# Patient Record
Sex: Female | Born: 1965
Health system: Southern US, Community
[De-identification: ages and names within clinical notes are randomized; demographics above are authoritative.]

## PROBLEM LIST (undated history)

## (undated) DIAGNOSIS — Z5189 Encounter for other specified aftercare: Secondary | ICD-10-CM

## (undated) DIAGNOSIS — Z86010 Personal history of colonic polyps: Secondary | ICD-10-CM

## (undated) DIAGNOSIS — K219 Gastro-esophageal reflux disease without esophagitis: Secondary | ICD-10-CM

## (undated) DIAGNOSIS — R768 Other specified abnormal immunological findings in serum: Secondary | ICD-10-CM

## (undated) HISTORY — PX: OVARIAN CYST SURGERY: SHX726

## (undated) HISTORY — PX: OTHER SURGICAL HISTORY: SHX169

## (undated) HISTORY — DX: Personal history of colonic polyps: Z86.010

## (undated) HISTORY — DX: Encounter for other specified aftercare: Z51.89

## (undated) HISTORY — PX: UPPER GASTROINTESTINAL ENDOSCOPY: SHX188

## (undated) HISTORY — DX: Other specified abnormal immunological findings in serum: R76.8

## (undated) HISTORY — DX: Gastro-esophageal reflux disease without esophagitis: K21.9

---

## 1993-11-08 DIAGNOSIS — Z5189 Encounter for other specified aftercare: Secondary | ICD-10-CM

## 1993-11-08 HISTORY — DX: Encounter for other specified aftercare: Z51.89

## 2007-11-09 DIAGNOSIS — R768 Other specified abnormal immunological findings in serum: Secondary | ICD-10-CM

## 2007-11-09 HISTORY — PX: COLONOSCOPY: SHX174

## 2007-11-09 HISTORY — PX: ESOPHAGOGASTRODUODENOSCOPY: SHX1529

## 2007-11-09 HISTORY — DX: Other specified abnormal immunological findings in serum: R76.8

## 2008-12-23 ENCOUNTER — Ambulatory Visit: Payer: Self-pay | Admitting: Gynecology

## 2009-02-24 ENCOUNTER — Ambulatory Visit: Payer: Self-pay | Admitting: Gynecology

## 2012-12-02 ENCOUNTER — Ambulatory Visit (INDEPENDENT_AMBULATORY_CARE_PROVIDER_SITE_OTHER): Payer: BC Managed Care – PPO | Admitting: Family Medicine

## 2012-12-02 VITALS — BP 140/84 | HR 88 | Temp 98.4°F | Resp 18 | Ht 60.75 in | Wt 130.0 lb

## 2012-12-02 DIAGNOSIS — R1011 Right upper quadrant pain: Secondary | ICD-10-CM

## 2012-12-02 DIAGNOSIS — R1012 Left upper quadrant pain: Secondary | ICD-10-CM

## 2012-12-02 DIAGNOSIS — R195 Other fecal abnormalities: Secondary | ICD-10-CM

## 2012-12-02 DIAGNOSIS — R319 Hematuria, unspecified: Secondary | ICD-10-CM

## 2012-12-02 DIAGNOSIS — R1013 Epigastric pain: Secondary | ICD-10-CM

## 2012-12-02 DIAGNOSIS — K921 Melena: Secondary | ICD-10-CM

## 2012-12-02 LAB — POCT CBC
HCT, POC: 42.1 % (ref 37.7–47.9)
Hemoglobin: 13.2 g/dL (ref 12.2–16.2)
Lymph, poc: 2 (ref 0.6–3.4)
MCH, POC: 26.7 pg — AB (ref 27–31.2)
MCHC: 31.4 g/dL — AB (ref 31.8–35.4)
MPV: 11 fL (ref 0–99.8)
POC LYMPH PERCENT: 35.7 %L (ref 10–50)
POC MID %: 5.4 %M (ref 0–12)
RDW, POC: 14.5 %
WBC: 5.6 10*3/uL (ref 4.6–10.2)

## 2012-12-02 LAB — POCT UA - MICROSCOPIC ONLY: Crystals, Ur, HPF, POC: NEGATIVE

## 2012-12-02 LAB — POCT URINALYSIS DIPSTICK
Bilirubin, UA: NEGATIVE
Glucose, UA: NEGATIVE
Spec Grav, UA: 1.02

## 2012-12-02 NOTE — Progress Notes (Signed)
Subjective:    Patient ID: Courtney Figueroa, female    DOB: 1966-09-29, 47 y.o.   MRN: [id number]  HPI Courtney Figueroa is a 47 y.o. female Language barrier - here with dtr - translating:  2 weeks ago - upset stomach - felt "upset".  Took dayquil for a possible cold.  More soreness after dayquil.  More stomach soreness past week..  No fever, no n/v, no diarrhea, no blood in stool.  Burning sensation in upper stomach.  More sore today after food. Taking prilosec - twice per day for past week.  Unknown if improving or not. Dark stool - 6 days ago and this am. Sometimes black stool and then normal stool.  Took pepto bismol  Day prior to dark stool and throughout this week. No dysuria or new urinary sx's.     Review of Systems  Constitutional: Negative for fever and chills.  Gastrointestinal: Positive for abdominal pain. Negative for nausea, vomiting and constipation.       Dark stool as in hpi.   Genitourinary: Negative for dysuria, urgency, frequency and difficulty urinating.  Neurological: Negative for dizziness and syncope.       Objective:   Physical Exam  Vitals reviewed. Constitutional: She is oriented to person, place, and time. She appears well-developed and well-nourished. No distress.  Pulmonary/Chest: Effort normal.  Abdominal: Soft. Bowel sounds are normal. She exhibits no distension. There is no hepatosplenomegaly. There is no tenderness. There is no CVA tenderness, no tenderness at McBurney's point and negative Murphy's sign. No hernia.  Neurological: She is alert and oriented to person, place, and time.  Skin: Skin is warm and dry. No rash noted. She is not diaphoretic.  Psychiatric: She has a normal mood and affect. Her behavior is normal.   Results for orders placed in visit on 12/02/12  IFOBT (OCCULT BLOOD)      Component Value Range   IFOBT Negative    POCT CBC      Component Value Range   WBC 5.6  4.6 - 10.2 K/uL   Lymph, poc 2.0  0.6 - 3.4   POC LYMPH PERCENT 35.7  10 - 50 %L     MID (cbc) 0.3  0 - 0.9   POC MID % 5.4  0 - 12 %M   POC Granulocyte 3.3  2 - 6.9   Granulocyte percent 58.9  37 - 80 %G   RBC 4.94  4.04 - 5.48 M/uL   Hemoglobin 13.2  12.2 - 16.2 g/dL   HCT, POC 40.9  81.1 - 47.9 %   MCV 85.3  80 - 97 fL   MCH, POC 26.7 (*) 27 - 31.2 pg   MCHC 31.4 (*) 31.8 - 35.4 g/dL   RDW, POC 91.4     Platelet Count, POC 285  142 - 424 K/uL   MPV 11.0  0 - 99.8 fL  POCT URINALYSIS DIPSTICK      Component Value Range   Color, UA yellow     Clarity, UA clear     Glucose, UA neg     Bilirubin, UA neg     Ketones, UA 15     Spec Grav, UA 1.020     Blood, UA small     pH, UA 7.0     Protein, UA neg     Urobilinogen, UA 0.2     Nitrite, UA neg     Leukocytes, UA Negative    POCT UA - MICROSCOPIC ONLY  Component Value Range   WBC, Ur, HPF, POC 0-1     RBC, urine, microscopic 2-5     Bacteria, U Microscopic trace     Mucus, UA neg     Epithelial cells, urine per micros 0-1     Crystals, Ur, HPF, POC neg     Casts, Ur, LPF, POC neg     Yeast, UA neg          Assessment & Plan:  Courtney Figueroa is a 47 y.o. female  1. Epigastric pain  IFOBT POC (occult bld, rslt in office), H. pylori antibody, IgG, Comprehensive metabolic panel, POCT CBC, Lipase, POCT urinalysis dipstick, POCT UA - Microscopic Only  2. LUQ abdominal pain  IFOBT POC (occult bld, rslt in office), H. pylori antibody, IgG, Comprehensive metabolic panel, POCT CBC, Lipase, POCT urinalysis dipstick, POCT UA - Microscopic Only  3. RUQ abdominal pain  IFOBT POC (occult bld, rslt in office), H. pylori antibody, IgG, Comprehensive metabolic panel, POCT CBC, Lipase, POCT urinalysis dipstick, POCT UA - Microscopic Only  4. Black stool  IFOBT POC (occult bld, rslt in office), H. pylori antibody, IgG, Comprehensive metabolic panel, POCT CBC, Lipase  5. Hematuria  Urine culture   Epigastric>LUQ/RUQ pain.  Symptomatology likely gastritis/GERD,  Dark stools likely d/t bismuth in Pepto use prior. Small  hematuria without urinary sx's - will check urine cx, cont PPI BID, labs above including h pylori, lipase, CMP.  Recheck in few days - if still with pain - consider recheck u/a vs CT to rule out nephrolith as cause. rtc precautions given.  Translator present - understanding expressed.   Patient Instructions  Drink plenty of fluids, continue omeprazole twice per day.  Avoid fried and fatty foods, and minimize caffeine use. Your should receive a call or letter about your lab results within the next week to 10 days.   Recheck in 3 days - 12/06/11 after 2pm.  Return to the clinic or go to the nearest emergency room if any of your symptoms worsen or new symptoms occur.

## 2012-12-02 NOTE — Patient Instructions (Addendum)
Drink plenty of fluids, continue omeprazole twice per day.  Avoid fried and fatty foods, and minimize caffeine use. Your should receive a call or letter about your lab results within the next week to 10 days.   Recheck in 3 days - 12/06/11 after 2pm.  Return to the clinic or go to the nearest emergency room if any of your symptoms worsen or new symptoms occur.

## 2012-12-03 LAB — COMPREHENSIVE METABOLIC PANEL
BUN: 8 mg/dL (ref 6–23)
CO2: 29 mEq/L (ref 19–32)
Glucose, Bld: 91 mg/dL (ref 70–99)
Sodium: 143 mEq/L (ref 135–145)
Total Bilirubin: 0.4 mg/dL (ref 0.3–1.2)
Total Protein: 7.2 g/dL (ref 6.0–8.3)

## 2012-12-05 ENCOUNTER — Encounter: Payer: Self-pay | Admitting: Family Medicine

## 2012-12-05 ENCOUNTER — Telehealth: Payer: Self-pay

## 2012-12-05 ENCOUNTER — Ambulatory Visit (INDEPENDENT_AMBULATORY_CARE_PROVIDER_SITE_OTHER): Payer: BC Managed Care – PPO | Admitting: Family Medicine

## 2012-12-05 VITALS — BP 119/76 | HR 66 | Temp 97.6°F | Resp 16 | Ht 61.5 in | Wt 129.5 lb

## 2012-12-05 DIAGNOSIS — R1013 Epigastric pain: Secondary | ICD-10-CM

## 2012-12-05 DIAGNOSIS — R3129 Other microscopic hematuria: Secondary | ICD-10-CM

## 2012-12-05 NOTE — Telephone Encounter (Signed)
I spoke to patients daughter and she has decided to come in today, I had previously advised her to come in tomorrow. FYI Amy

## 2012-12-05 NOTE — Telephone Encounter (Signed)
PT CANNOT COME BACK IN AS INSTRUCTED SINCE IT IS SNOWING. WOULD LIKE TO GET HER RESULTS OVER THE PHONE PLEASE CALL [phone]

## 2012-12-05 NOTE — Progress Notes (Signed)
Subjective:    Patient ID: Courtney Figueroa, female    DOB: 1966/07/09, 47 y.o.   MRN: [id number]  HPI See last ov. Courtney Figueroa is a 47 y.o. female Seen 12/02/12 with epigastric pain x 2 weeks.  Microscopic hematuria noted.  Suspected GERD/gastritis - started on omeprazole.  Lab results form last ov: Results for orders placed in visit on 12/02/12  IFOBT (OCCULT BLOOD)      Component Value Range   IFOBT Negative    H. PYLORI ANTIBODY, IGG      Component Value Range   H Pylori IgG 1.59 (*)   COMPREHENSIVE METABOLIC PANEL      Component Value Range   Sodium 143  135 - 145 mEq/L   Potassium 3.7  3.5 - 5.3 mEq/L   Chloride 105  96 - 112 mEq/L   CO2 29  19 - 32 mEq/L   Glucose, Bld 91  70 - 99 mg/dL   BUN 8  6 - 23 mg/dL   Creat 1.19  1.47 - 8.29 mg/dL   Total Bilirubin 0.4  0.3 - 1.2 mg/dL   Alkaline Phosphatase 54  39 - 117 U/L   AST 17  0 - 37 U/L   ALT 12  0 - 35 U/L   Total Protein 7.2  6.0 - 8.3 g/dL   Albumin 4.6  3.5 - 5.2 g/dL   Calcium 9.6  8.4 - 56.2 mg/dL  POCT CBC      Component Value Range   WBC 5.6  4.6 - 10.2 K/uL   Lymph, poc 2.0  0.6 - 3.4   POC LYMPH PERCENT 35.7  10 - 50 %L   MID (cbc) 0.3  0 - 0.9   POC MID % 5.4  0 - 12 %M   POC Granulocyte 3.3  2 - 6.9   Granulocyte percent 58.9  37 - 80 %G   RBC 4.94  4.04 - 5.48 M/uL   Hemoglobin 13.2  12.2 - 16.2 g/dL   HCT, POC 13.0  86.5 - 47.9 %   MCV 85.3  80 - 97 fL   MCH, POC 26.7 (*) 27 - 31.2 pg   MCHC 31.4 (*) 31.8 - 35.4 g/dL   RDW, POC 78.4     Platelet Count, POC 285  142 - 424 K/uL   MPV 11.0  0 - 99.8 fL  LIPASE      Component Value Range   Lipase 10  0 - 75 U/L  POCT URINALYSIS DIPSTICK      Component Value Range   Color, UA yellow     Clarity, UA clear     Glucose, UA neg     Bilirubin, UA neg     Ketones, UA 15     Spec Grav, UA 1.020     Blood, UA small     pH, UA 7.0     Protein, UA neg     Urobilinogen, UA 0.2     Nitrite, UA neg     Leukocytes, UA Negative    POCT UA - MICROSCOPIC ONLY   Component Value Range   WBC, Ur, HPF, POC 0-1     RBC, urine, microscopic 2-5     Bacteria, U Microscopic trace     Mucus, UA neg     Epithelial cells, urine per micros 0-1     Crystals, Ur, HPF, POC neg     Casts, Ur, LPF, POC neg     Yeast, UA neg  URINE CULTURE      Component Value Range   Colony Count 8,000 COLONIES/ML     Organism ID, Bacteria Insignificant Growth     Here for follow up.  did not take omeprazole - though would be too strong, but sx's resolved the next day with taking zantac otc twice per day, and adjusting diet.  No pain since day after last office visit. No urinary symptoms or blood.   Discussed above results - thinks she had H pylori testing and treated with antibiotic about 5 years ago in [place].    Review of Systems As above. No new abd pain, n/v, dysuria or hematuria    Objective:   Physical Exam  Constitutional: She appears well-developed and well-nourished.  Pulmonary/Chest: Effort normal and breath sounds normal.  Abdominal: Soft. Bowel sounds are normal. There is no hepatosplenomegaly. There is no tenderness. There is no rebound and no CVA tenderness.  Skin: Skin is warm and dry.  Psychiatric: She has a normal mood and affect.       Assessment & Plan:  Courtney Figueroa is a 47 y.o. female 1. Epigastric pain - resolved with zantac otc and diet change.  Hx of treated H pylori by hx, but if sx's recur - may need endoscopy or GI eval.  Recheck in few weeks.   2. Microscopic hematuria - urine cx negative, plan on recheck in next few weeks. If persists - may need urology eval.    Patient Instructions  Continue to watch diet, and continue zantac.  Recheck in 2-3 weeks to recheck urine for blood.  You can try to stop the zantac in 2 weeks, but if pain comes back - restart medicine.  Return to the clinic or go to the nearest emergency room if any of your symptoms worsen or new symptoms occur.

## 2012-12-05 NOTE — Patient Instructions (Signed)
Continue to watch diet, and continue zantac.  Recheck in 2-3 weeks to recheck urine for blood.  You can try to stop the zantac in 2 weeks, but if pain comes back - restart medicine.  Return to the clinic or go to the nearest emergency room if any of your symptoms worsen or new symptoms occur.

## 2012-12-18 ENCOUNTER — Ambulatory Visit: Payer: BC Managed Care – PPO | Admitting: Family Medicine

## 2013-02-12 ENCOUNTER — Telehealth: Payer: Self-pay

## 2013-02-12 DIAGNOSIS — G8929 Other chronic pain: Secondary | ICD-10-CM

## 2013-02-12 NOTE — Telephone Encounter (Signed)
Referral made 

## 2013-02-12 NOTE — Telephone Encounter (Signed)
Patient's sister advised.

## 2013-02-12 NOTE — Telephone Encounter (Signed)
Continued epigastric pain, please advise if okay to refer, pended referral.

## 2013-02-12 NOTE — Telephone Encounter (Signed)
Pt sister Fidela Juneau called stating that she would like her sister to have a referral to dr Leone Payor   Best number [phone]

## 2013-02-13 ENCOUNTER — Encounter: Payer: Self-pay | Admitting: Gastroenterology

## 2013-02-20 ENCOUNTER — Encounter: Payer: Self-pay | Admitting: Internal Medicine

## 2013-02-20 ENCOUNTER — Ambulatory Visit (INDEPENDENT_AMBULATORY_CARE_PROVIDER_SITE_OTHER): Payer: BC Managed Care – PPO | Admitting: Internal Medicine

## 2013-02-20 VITALS — BP 120/70 | HR 80 | Ht 61.0 in | Wt 126.2 lb

## 2013-02-20 DIAGNOSIS — R1013 Epigastric pain: Secondary | ICD-10-CM

## 2013-02-20 DIAGNOSIS — R634 Abnormal weight loss: Secondary | ICD-10-CM

## 2013-02-20 MED ORDER — ESOMEPRAZOLE MAGNESIUM 40 MG PO CPDR: 40.0000 mg | DELAYED_RELEASE_CAPSULE | Freq: Every day | ORAL | Status: DC

## 2013-02-20 NOTE — Patient Instructions (Addendum)
You have been scheduled for an endoscopy with propofol. Please follow written instructions given to you at your visit today. If you use inhalers (even only as needed), please bring them with you on the day of your procedure. Make sure to have an interpreter (age 49 or older) present for your procedure.  You have been scheduled for an abdominal ultrasound at [facility], [address] [place] on 02/21/13 at 8:00 am. Please arrive 15 minutes prior to your appointment for registration. Make certain not to have anything to eat or drink 6 hours prior to your appointment. Should you need to reschedule your appointment, please contact radiology at [phone]. This test typically takes about 30 minutes to perform. Please also make sure to have an interpreter with you (age 66 or older)  Please take Nexium 40 mg one capsule 30 minutes before breakfast in place of Prilosec. We have given you Nexium samples.  Thank you for choosing me and [facility].  Iva Boop, M.D., [facility]

## 2013-02-20 NOTE — Progress Notes (Signed)
  Subjective:    Patient ID: Courtney Figueroa, female    DOB: Oct 03, 1966, 47 y.o.   MRN: [id number]  HPI The patient is here with her very close friend who translates. The patient has been having epigastric pain. Fairly chronic. Goes back several months at least this time. Prilosec OTC helps him. However she has persistent epigastric pain eating often. She occasionally uses Motrin and that upsets her stomach some as well though not a regular basis. There is some heartburn and indigestion as well. When in [place] 5 years ago she had an EGD and a colonoscopy. She was told she had an ulcer scar. She was treated for H. pylori. More recently she was seen in urgent medical and family care where CBC and lipase were normal. She does get spells of pain she'll be bother for several days. There's been some mild weight loss, eating makes the pain somewhat worse. There has been no evidence of bleeding. The remainder of the GI review of systems is negative.   Review of Systems As per history of present illness, also positive for sun poisoning problems, some fatigue, she has a frozen shoulder problem and mild pedal edema at times. All other review of systems are negative.    Objective:   Physical Exam General:  Well-developed, well-nourished and in no acute distress Eyes:  anicteric. ENT:   Mouth and posterior pharynx free of lesions.  Neck:   supple w/o thyromegaly or mass.  Lungs: Clear to auscultation bilaterally. Heart:  S1S2, no rubs, murmurs, gallops. Abdomen:  soft, mildly tender epigastrium, no hepatosplenomegaly, hernia, or mass and BS+.  Lymph:  no cervical or supraclavicular adenopathy. Extremities:   no edema Skin   no rash some scarring neck, arms Neuro:  A&O x 3.  Psych:  appropriate mood and  Affect.   Data Reviewed: Lab Results  Component Value Date   LIPASE 10 12/02/2012   Lab Results  Component Value Date   WBC 5.6 12/02/2012   HGB 13.2 12/02/2012   HCT 42.1 12/02/2012   MCV 85.3 12/02/2012    Lab Results  Component Value Date   ALT 12 12/02/2012   AST 17 12/02/2012   ALKPHOS 54 12/02/2012   BILITOT 0.4 12/02/2012   [facility] notes 11/2012    Assessment & Plan:   1. Abdominal pain, epigastric   2. Loss of weight    1. Evaluate with ultrasound of the abdomen, she could have gallstones causing this abdominal pain though it sounds more like nonulcer dyspepsia, reflux. 2. Schedule EGD as well. Benefit and indications reviewed with the patient 3. Further plans pending these 4. In the meantime trial of Nexium 40 mg daily instead of her Prilosec to see if that improves her symptom relief which is minimal at the Prilosec.

## 2013-02-21 ENCOUNTER — Ambulatory Visit: Payer: Self-pay | Admitting: Gastroenterology

## 2013-02-21 ENCOUNTER — Encounter: Payer: Self-pay | Admitting: Internal Medicine

## 2013-02-21 ENCOUNTER — Ambulatory Visit
Admission: RE | Admit: 2013-02-21 | Discharge: 2013-02-21 | Disposition: A | Discharge status: Home / Self Care | Payer: BC Managed Care – PPO | Source: Ambulatory Visit | Attending: Internal Medicine | Admitting: Internal Medicine

## 2013-02-21 DIAGNOSIS — R1013 Epigastric pain: Secondary | ICD-10-CM

## 2013-02-21 DIAGNOSIS — R634 Abnormal weight loss: Secondary | ICD-10-CM

## 2013-02-23 NOTE — Progress Notes (Signed)
Quick Note:  [place] is ok Please send a letter saying it is ok Can try to call - patient speaks limited English though Friend was interpreter ______

## 2013-03-13 ENCOUNTER — Encounter: Payer: Self-pay | Admitting: Internal Medicine

## 2013-03-13 ENCOUNTER — Ambulatory Visit (AMBULATORY_SURGERY_CENTER): Payer: BC Managed Care – PPO | Admitting: Internal Medicine

## 2013-03-13 VITALS — BP 113/76 | HR 64 | Temp 97.1°F | Resp 20 | Ht 62.0 in | Wt 126.0 lb

## 2013-03-13 DIAGNOSIS — R634 Abnormal weight loss: Secondary | ICD-10-CM

## 2013-03-13 DIAGNOSIS — R1013 Epigastric pain: Secondary | ICD-10-CM

## 2013-03-13 MED ORDER — SODIUM CHLORIDE 0.9 % IV SOLN: 500.0000 mL | INTRAVENOUS | Status: DC

## 2013-03-13 MED ORDER — ESOMEPRAZOLE MAGNESIUM 40 MG PO CPDR: 40.0000 mg | DELAYED_RELEASE_CAPSULE | Freq: Every day | ORAL | Status: DC

## 2013-03-13 NOTE — Patient Instructions (Addendum)
The esophagus, stomach and duodenum look normal. If the Nexium is helping you can continue that - I have sent a prescription to your pharmacy.  Thank you for choosing me and [facility].  YOU MAY TAKE YOUR NEXIUM IN THE EVENING IF NEEDED PRE DR GESSNER.  Iva Boop, MD, FACG YOU HAD AN ENDOSCOPIC PROCEDURE TODAY AT THE [facility]: Refer to the procedure report that was given to you for any specific questions about what was found during the examination.  If the procedure report does not answer your questions, please call your gastroenterologist to clarify.  If you requested that your care partner not be given the details of your procedure findings, then the procedure report has been included in a sealed envelope for you to review at your convenience later.  YOU SHOULD EXPECT: Some feelings of bloating in the abdomen. Passage of more gas than usual.  Walking can help get rid of the air that was put into your GI tract during the procedure and reduce the bloating. If you had a lower endoscopy (such as a colonoscopy or flexible sigmoidoscopy) you may notice spotting of blood in your stool or on the toilet paper. If you underwent a bowel prep for your procedure, then you may not have a normal bowel movement for a few days.  DIET: Your first meal following the procedure should be a light meal and then it is ok to progress to your normal diet.  A half-sandwich or bowl of soup is an example of a good first meal.  Heavy or fried foods are harder to digest and may make you feel nauseous or bloated.  Likewise meals heavy in dairy and vegetables can cause extra gas to form and this can also increase the bloating.  Drink plenty of fluids but you should avoid alcoholic beverages for 24 hours.  ACTIVITY: Your care partner should take you home directly after the procedure.  You should plan to take it easy, moving slowly for the rest of the day.  You can resume normal activity the day  after the procedure however you should NOT DRIVE or use heavy machinery for 24 hours (because of the sedation medicines used during the test).    SYMPTOMS TO REPORT IMMEDIATELY: A gastroenterologist can be reached at any hour.  During normal business hours, 8:30 AM to 5:00 PM Monday through Friday, call [phone].  After hours and on weekends, please call the GI answering service at [phone] who will take a message and have the physician on call contact you.   Following upper endoscopy (EGD)  Vomiting of blood or coffee ground material  New chest pain or pain under the shoulder blades  Painful or persistently difficult swallowing  New shortness of breath  Fever of 100F or higher  Black, tarry-looking stools  FOLLOW UP: If any biopsies were taken you will be contacted by phone or by letter within the next 1-3 weeks.  Call your gastroenterologist if you have not heard about the biopsies in 3 weeks.  Our staff will call the home number listed on your records the next business day following your procedure to check on you and address any questions or concerns that you may have at that time regarding the information given to you following your procedure. This is a courtesy call and so if there is no answer at the home number and we have not heard from you through the emergency physician on call, we will assume that you  have returned to your regular daily activities without incident.  SIGNATURES/CONFIDENTIALITY: You and/or your care partner have signed paperwork which will be entered into your electronic medical record.  These signatures attest to the fact that that the information above on your After Visit Summary has been reviewed and is understood.  Full responsibility of the confidentiality of this discharge information lies with you and/or your care-partner.

## 2013-03-13 NOTE — Progress Notes (Signed)
Patient did not have preoperative order for IV antibiotic SSI prophylaxis. (G8918)  Patient did not experience any of the following events: a burn prior to discharge; a fall within the facility; wrong site/side/patient/procedure/implant event; or a hospital transfer or hospital admission upon discharge from the facility. (G8907)  

## 2013-03-13 NOTE — Op Note (Addendum)
[facility] [address] [place], [address]   ENDOSCOPY PROCEDURE REPORT  PATIENT: Courtney, Figueroa  MR#: [id number] BIRTHDATE: 02/24/1966 , 47  yrs. old GENDER: Female ENDOSCOPIST: Iva Boop, MD, [facility] REFERRED BY: PROCEDURE DATE:  03/13/2013 PROCEDURE:  EGD, diagnostic ASA CLASS:     Class II INDICATIONS:  Epigastric pain.   Weight loss. MEDICATIONS: propofol (Diprivan) 100mg  IV TOPICAL ANESTHETIC: none  DESCRIPTION OF PROCEDURE: After the risks benefits and alternatives of the procedure were thoroughly explained, informed consent was obtained.  The LB GIF-H180 [id number] endoscope was introduced through the mouth and advanced to the second portion of the duodenum. Without limitations.  The instrument was slowly withdrawn as the mucosa was fully examined.      The upper, middle and distal third of the esophagus were carefully inspected and no abnormalities were noted.  The z-line was well seen at the GEJ.  The endoscope was pushed into the fundus which was normal including a retroflexed view.  The antrum, gastric body, first and second part of the duodenum were unremarkable. Retroflexed views revealed no abnormalities.     The scope was then withdrawn from the patient and the procedure completed.  COMPLICATIONS: There were no complications. ENDOSCOPIC IMPRESSION: Normal EGD  RECOMMENDATIONS: 1.  Continue PPI - Nexium 40 mg qd for at least 2 months - can consider stopping at 2 months to see how she does 2.   follow-up as needed  REPEAT EXAM:  eSigned:  Iva Boop, MD, [facility] 03/13/2013 9:53 AM   CC:The Patient

## 2013-03-13 NOTE — Progress Notes (Signed)
Lidocaine-40mg IV prior to Propofol InductionPropofol given over incremental dosages 

## 2013-03-14 ENCOUNTER — Telehealth: Payer: Self-pay

## 2013-03-14 NOTE — Telephone Encounter (Signed)
Left message on answering machine. 

## 2013-04-02 ENCOUNTER — Inpatient Hospital Stay (HOSPITAL_COMMUNITY)
Admission: AD | Admit: 2013-04-02 | Discharge: 2013-04-02 | Disposition: A | Discharge status: Home / Self Care | Payer: BC Managed Care – PPO | Source: Ambulatory Visit | Attending: Obstetrics & Gynecology | Admitting: Obstetrics & Gynecology

## 2013-04-02 ENCOUNTER — Encounter (HOSPITAL_COMMUNITY): Payer: Self-pay | Admitting: *Deleted

## 2013-04-02 DIAGNOSIS — N751 Abscess of Bartholin's gland: Secondary | ICD-10-CM

## 2013-04-02 DIAGNOSIS — N949 Unspecified condition associated with female genital organs and menstrual cycle: Secondary | ICD-10-CM | POA: Insufficient documentation

## 2013-04-02 MED ORDER — IBUPROFEN 600 MG PO TABS: 600.0000 mg | ORAL_TABLET | Freq: Four times a day (QID) | ORAL | Status: DC | PRN

## 2013-04-02 MED ORDER — OXYCODONE-ACETAMINOPHEN 5-325 MG PO TABS: 1.0000 | ORAL_TABLET | ORAL | Status: DC | PRN

## 2013-04-02 NOTE — MAU Note (Signed)
D. Poe CNM expressed boil sm amt of blood and pus returned.  Instructed pt to do sitz bath or warm soaks three times per day.

## 2013-04-02 NOTE — MAU Note (Signed)
Pt had a boil on labia two weeks ago and treated with boil ease ointment.  Seemed to be going away but skin irritation began yesterday and worsened today; when she arrived in MAU to get the pain treated, she started bleeding and the pain instantly subsided.

## 2013-04-02 NOTE — MAU Provider Note (Signed)
CC: Vaginal Bleeding  Pt understands and speaks some English. Daughter translates. Reviewed with Language [employer] interpreter pre -discharge.    HPI Courtney Figueroa is a 47 y.o. [id number]  who presents with 2 week history of having a painful "boil" and vulvar area. Treated with OTC "Boileze" and pain lessened initially but for the last 2 days lump became larger and it has been very painful again. Unable to sleep due to severe pain. When she got out of the car walking into MAU, the area began bleeding and pain resolved. No previous similar episodes. Menses very irregular for the past year. No regular gynecologic care.  Past Medical History  Diagnosis Date  . GERD (gastroesophageal reflux disease)   . Helicobacter pylori ab+ 2009    treated in [place]     OB History   Grav Para Term Preterm Abortions TAB SAB Ect Mult Living   2 2 2       2      # Outc Date GA Lbr Len/2nd Wgt Sex Del Anes PTL Lv   1 TRM            2 TRM               Past Surgical History  Procedure Laterality Date  . Ovarian cyst surgery Bilateral   . Esophagogastroduodenoscopy  2009    [place] - ulcer scar  . Colonoscopy  2009    [place] - ok    History   Social History  . Marital Status: Married    Spouse Name: N/A    Number of Children: 2  . Years of Education: N/A   Occupational History  . tailor    Social History Main Topics  . Smoking status: Never Smoker   . Smokeless tobacco: Never Used  . Alcohol Use: No  . Drug Use: No  . Sexually Active: No   Other Topics Concern  . Not on file   Social History Narrative   Married, 1 son one daughter   Graded from [place]   Works as a tailor   Caffeine   02/20/2013          No current facility-administered medications on file prior to encounter.   Current Outpatient Prescriptions on File Prior to Encounter  Medication Sig Dispense Refill  . esomeprazole (NEXIUM) 40 MG capsule Take 1 capsule (40 mg total) by mouth daily before breakfast.  30 capsule  5     No Known Allergies  ROS Pertinent items in HPI  PHYSICAL EXAM Filed Vitals:   04/02/13 1615  BP: 141/76  Pulse: 76  Temp: 97.6 F (36.4 C)  Resp: 16   General: Well nourished, well developed female in no acute distress Cardiovascular: Normal rate Respiratory: Normal effort Abdomen: Soft, nontender Back: No CVAT Extremities: No edema Neurologic: Alert and oriented External genital exam: Left lower medial Labium minora draining sanguinous and slightly purulent material. Palpated 2 cm size soft slightly tender nonfluctuant mass and small amount drainage manually expressed with sllght discomfort. No erythema orf surrounding skin    ASSESSMENT  1. Bartholin's gland abscess   Spontaneoulsy drained with relief of pain  PLAN Discharge home. See AVS for patient education. Advised Sitz, local warm compresses if needed, analgesia   Medication List    TAKE these medications       acetaminophen 325 MG tablet  Commonly known as:  TYLENOL  Take 650 mg by mouth every 6 (six) hours as needed for pain.  Benzocaine 20 % Oint  Apply 1 application topically 4 (four) times daily.     esomeprazole 40 MG capsule  Commonly known as:  NEXIUM  Take 1 capsule (40 mg total) by mouth daily before breakfast.     ibuprofen 600 MG tablet  Commonly known as:  ADVIL,MOTRIN  Take 1 tablet (600 mg total) by mouth every 6 (six) hours as needed for pain.     oxyCODONE-acetaminophen 5-325 MG per tablet  Commonly known as:  PERCOCET/ROXICET  Take 1 tablet by mouth every 4 (four) hours as needed for pain.       Follow-up Information   Follow up with [facility]. (Someone from [facility] clinic will call with an appointment in one to 2 weeks)    Contact information:   [phone]    Will consider biopsy at [facility] if indicated    Danae Orleans, CNM 04/02/2013 4:39 PM

## 2013-04-16 ENCOUNTER — Encounter: Payer: Self-pay | Admitting: Medical

## 2013-04-16 ENCOUNTER — Ambulatory Visit (INDEPENDENT_AMBULATORY_CARE_PROVIDER_SITE_OTHER): Payer: BC Managed Care – PPO | Admitting: Medical

## 2013-04-16 VITALS — BP 112/75 | HR 87 | Temp 98.3°F | Ht 62.0 in | Wt 124.2 lb

## 2013-04-16 DIAGNOSIS — N750 Cyst of Bartholin's gland: Secondary | ICD-10-CM

## 2013-04-16 MED ORDER — SULFAMETHOXAZOLE-TRIMETHOPRIM 800-160 MG PO TABS: 1.0000 | ORAL_TABLET | Freq: Two times a day (BID) | ORAL | Status: DC

## 2013-04-16 NOTE — Progress Notes (Signed)
Patient ID: Courtney Figueroa, female   DOB: [birth year], 47 y.o.   MRN: [id number]  History:  [name]  is a 47 y.o. [id number] who presents to clinic today for follow-up from MAU. The patient was seen in MAU on 04/02/13 with a bartholin's gland abscess. The abscess was draining spontaneously that day. The patient states that the swelling has improved significantly since then. She states that she has frequent discomfort when sitting, but no pain. She denies any further drainage recently. She denies fever.    The following portions of the patient's history were reviewed and updated as appropriate: allergies, current medications, past family history, past medical history, past social history, past surgical history and problem list.  Review of Systems:  Pertinent items are noted in HPI.  Objective:  Physical Exam BP 112/75  Pulse 87  Temp(Src) 98.3 F (36.8 C)  Ht 5\' 2"  (1.575 m)  Wt 124 lb 3.2 oz (56.337 kg)  BMI 22.71 kg/m2  LMP 02/09/2013 GENERAL: Well-developed, well-nourished female in no acute distress.  HEENT: Normocephalic, atraumatic.  LUNGS: Normal effort HEART: Regular rate  PELVIC: Normal external female genitalia. Left labia still slightly larger than the right in the area of the bartholin's gland. No drainage, fluctuance or erythema noted. Patient is very mildly tender on exam. EXTREMITIES: No cyanosis, clubbing, or edema   Assessment & Plan:  Assessment: Bartholin's gland abscess resolving  Plans: 1. Rx for Bactrim sent to patient's pharmacy 2. Warning signs for recurrence and/or infection discussed 3. Patient to return to clinic PRN  Freddi Starr, PA-C 04/16/2013 4:47 PM

## 2013-04-16 NOTE — Patient Instructions (Signed)
Bartholin's Cyst and Abscess  Bartholin's glands produce mucus through small openings just outside the opening of the vagina. The mucus helps with lubrication around the vagina during sexual intercourse. If the duct becomes clogged, the gland will swell and cause a bulge on the inside of the vagina. If this becomes big enough, it can be seen and felt on the outside of the vagina as well. Sometimes, the swelling will shrink away by itself. However, if the cyst becomes infected, the Bartholin's cyst fills with pus and becomes more swollen, red and painful and becomes a Bartholin's abscess. This usually requires antibiotic treatment and surgical drainage. Sometimes, with minor surgery under local anesthesia, a small tube is placed in the cyst or abscess wall. This allows continued drainage for up to 6 weeks. Minor surgery can make a new opening to replace the clogged duct and help prevent future cysts or abscess.  If the abscess occurs several times, a minor operation with local anesthesia is necessary to remove the Bartholin's gland completely or to make it drain better. Cutting open the gland and suturing the edges to make the opening of the gland bigger (marsupialization) may be needed and should usually be done by your obstetrician-gyncology physician. Antibiotics are usually prescribed for this condition. Take all antibiotics as prescribed. Make sure to finish them even if you are doing better. Take warm sitz baths for 20 minutes, 3 times a day. See your caregiver for follow-up care as recommended.  SEEK MEDICAL CARE IF:    You have increasing pain, swelling, or redness near the vagina.   You have vomiting or inability to tolerate medicines.   You have a fever.   You have uncontrolled bleeding from the vagina.  Document Released: 12/02/2004 Document Revised: 01/17/2012 Document Reviewed: 12/05/2009  [employer] Patient Information 2014 [employer].

## 2013-06-21 ENCOUNTER — Telehealth: Payer: Self-pay | Admitting: Family Medicine

## 2013-06-21 NOTE — Telephone Encounter (Signed)
Pt and her husband would like to be new pt w/ Dr Selena Batten. They will need interprter and would like to establish at the same time. Is it of to scheduler new pt appts on august 22? There is a slot at 8:15 and 9:30 , but they need together. We could block the other slots. Pls advise.

## 2013-06-21 NOTE — Telephone Encounter (Signed)
Yes - ok to block two 30 minute appts back to back so can come with interpreter - will need medical interpreter.

## 2013-06-22 NOTE — Telephone Encounter (Signed)
lmom/kh 

## 2013-06-26 NOTE — Telephone Encounter (Signed)
lmom/kh 

## 2013-06-26 NOTE — Telephone Encounter (Signed)
Done

## 2013-06-29 ENCOUNTER — Ambulatory Visit (INDEPENDENT_AMBULATORY_CARE_PROVIDER_SITE_OTHER): Payer: BC Managed Care – PPO | Admitting: Family Medicine

## 2013-06-29 ENCOUNTER — Encounter: Payer: Self-pay | Admitting: Family Medicine

## 2013-06-29 VITALS — BP 110/76 | Temp 98.3°F | Ht 61.25 in | Wt 124.0 lb

## 2013-06-29 DIAGNOSIS — Z23 Encounter for immunization: Secondary | ICD-10-CM

## 2013-06-29 DIAGNOSIS — Z7189 Other specified counseling: Secondary | ICD-10-CM

## 2013-06-29 DIAGNOSIS — K219 Gastro-esophageal reflux disease without esophagitis: Secondary | ICD-10-CM

## 2013-06-29 DIAGNOSIS — Z7689 Persons encountering health services in other specified circumstances: Secondary | ICD-10-CM

## 2013-06-29 LAB — LIPID PANEL
LDL Cholesterol: 119 mg/dL — ABNORMAL HIGH (ref 0–99)
Total CHOL/HDL Ratio: 4
Triglycerides: 66 mg/dL (ref 0.0–149.0)

## 2013-06-29 NOTE — Patient Instructions (Addendum)
-  We have ordered labs or studies at this visit. It can take up to 1-2 weeks for results and processing. We will contact you with instructions IF your results are abnormal. Normal results will be released to your [facility]. If you have not heard from [place] or can not find your results in [facility] in 2 weeks please contact our office.  -PLEASE SIGN UP FOR MYCHART TODAY   We recommend the following healthy lifestyle measures: - eat a healthy diet consisting of lots of vegetables, fruits, beans, nuts, seeds, healthy meats such as white chicken and fish and whole grains.  - avoid fried foods, fast food, processed foods, sodas, red meet and other fattening foods.  - get a least 150 minutes of aerobic exercise per week.   -get your pap smear, breast exam and mammogram with your gynecologist  Follow up in: 1 year or as needed

## 2013-06-29 NOTE — Addendum Note (Signed)
Addended by: Azucena Freed on: 06/29/2013 08:57 AM   Modules accepted: Orders

## 2013-06-29 NOTE — Progress Notes (Signed)
Quick Note:  Called and spoke with pt's husband and he is aware. ______

## 2013-06-29 NOTE — Progress Notes (Signed)
Chief Complaint  Patient presents with  . Establish Care    HPI:  Courtney Figueroa is here to establish care. She and her husband are originally from [place]. She has sen GI recently for GERD and had a normal EGD with recs for two months of PPI therapy. She also is followed by gyn for a recently drained Bartholin gland cyst. Last PCP and physical: has physical set up in september  Has the following chronic problems and concerns today:  Patient Active Problem List   Diagnosis Date Noted  . GERD (gastroesophageal reflux disease) 06/29/2013    Health Maintenance: -will get female exam, pap, mammo and breast exam with gyn -offered tdap today  ROS: See pertinent positives and negatives per HPI.  Past Medical History  Diagnosis Date  . GERD (gastroesophageal reflux disease)   . Helicobacter pylori ab+ 2009    treated in [place]    Past Surgical History  Procedure Laterality Date  . Ovarian cyst surgery Bilateral   . Esophagogastroduodenoscopy  2009    [place] - ulcer scar  . Colonoscopy  2009    [place] - ok  . Bartholin gland      History reviewed. No pertinent family history.  History   Social History  . Marital Status: Married    Spouse Name: N/A    Number of Children: 2  . Years of Education: N/A   Occupational History  . tailor    Social History Main Topics  . Smoking status: Never Smoker   . Smokeless tobacco: Never Used  . Alcohol Use: No  . Drug Use: No  . Sexual Activity: No   Other Topics Concern  . None   Social History Narrative   Married, 1 son (55 yo)one daughter (16) in 2014   Graded from [place]   Works as a tailor    Caffeine   02/20/2013   No regular exercise; diet if fair                    Current outpatient prescriptions:omeprazole (PRILOSEC) 40 MG capsule, Take 40 mg by mouth daily., Disp: , Rfl:   EXAM:  Filed Vitals:   06/29/13 0809  BP: 110/76  Temp: 98.3 F (36.8 C)    Body mass index is 23.23 kg/(m^2).  GENERAL: vitals  reviewed and listed above, alert, oriented, appears well hydrated and in no acute distress  HEENT: atraumatic, conjunttiva clear, no obvious abnormalities on inspection of external nose and ears  NECK: no obvious masses on inspection  LUNGS: clear to auscultation bilaterally, no wheezes, rales or rhonchi, good air movement  CV: HRRR, no peripheral edema  MS: moves all extremities without noticeable abnormality  PSYCH: pleasant and cooperative, no obvious depression or anxiety  ASSESSMENT AND PLAN:  Discussed the following assessment and plan:  GERD (gastroesophageal reflux disease)  Encounter to establish care - Plan: Hemoglobin A1c, Lipid panel  -We reviewed the PMH, PSH, FH, SH, Meds and Allergies. -We provided refills for any medications we will prescribe as needed. -We addressed current concerns per orders and patient instructions. -We have asked for records for pertinent exams, studies, vaccines and notes from previous providers. -We have advised patient to follow up per instructions below. -tdap today -continue prilosec for 1-2 month more then trial off, she will notify us if symptoms return -follow up in 1 year  -Patient advised to return or notify a doctor immediately if symptoms worsen or persist or new concerns arise.  Patient Instructions  -  We have ordered labs or studies at this visit. It can take up to 1-2 weeks for results and processing. We will contact you with instructions IF your results are abnormal. Normal results will be released to your [facility]. If you have not heard from [place] or can not find your results in [facility] in 2 weeks please contact our office.  -PLEASE SIGN UP FOR MYCHART TODAY   We recommend the following healthy lifestyle measures: - eat a healthy diet consisting of lots of vegetables, fruits, beans, nuts, seeds, healthy meats such as white chicken and fish and whole grains.  - avoid fried foods, fast food, processed foods, sodas, red meet and  other fattening foods.  - get a least 150 minutes of aerobic exercise per week.   -get your pap smear, breast exam and mammogram with your gynecologist  Follow up in: 1 year or as needed      Betzaida Cremeens R.

## 2014-02-27 ENCOUNTER — Ambulatory Visit (INDEPENDENT_AMBULATORY_CARE_PROVIDER_SITE_OTHER): Payer: BC Managed Care – PPO | Admitting: Family Medicine

## 2014-02-27 ENCOUNTER — Encounter: Payer: Self-pay | Admitting: Family Medicine

## 2014-02-27 VITALS — BP 132/80 | HR 74 | Temp 97.9°F | Ht 61.25 in | Wt 117.0 lb

## 2014-02-27 DIAGNOSIS — K219 Gastro-esophageal reflux disease without esophagitis: Secondary | ICD-10-CM

## 2014-02-27 NOTE — Patient Instructions (Addendum)
-  schedule physical exam in 2 months  -prilosec 20mg  daily, 40mg  for 3 days if you eat the wrong foods - follow up with your gastroenterologist if worsening or not improving  -please call you gastroenterologist and let them know it is time for you colonoscopy  -schedule your mammogram

## 2014-02-27 NOTE — Progress Notes (Signed)
Pre visit review using our clinic review tool, if applicable. No additional management support is needed unless otherwise documented below in the visit note. 

## 2014-02-27 NOTE — Progress Notes (Signed)
No chief complaint on file.   HPI:  GI issues: -followed by GI -normal [place] and EGD last year -reports ate a lot of spicy and fried food and had bad abdominal pain, heartburn and reflux - better today -reports has been taking prilosec 20mg  daily and this usually works but occ has symptoms if eats the wrong things -denies: fevers, diarrhea, vomiting, costipation, melena, dysphagia, weight loss  ROS: See pertinent positives and negatives per HPI.  Past Medical History  Diagnosis Date  . GERD (gastroesophageal reflux disease)   . Helicobacter pylori ab+ 2009    treated in [place]     Past Surgical History  Procedure Laterality Date  . Ovarian cyst surgery Bilateral   . Esophagogastroduodenoscopy  2009    [place] - ulcer scar  . Colonoscopy  2009    [place] - ok  . Bartholin gland      No family history on file.  History   Social History  . Marital Status: Married    Spouse Name: N/A    Number of Children: 2  . Years of Education: N/A   Occupational History  . tailor    Social History Main Topics  . Smoking status: Never Smoker   . Smokeless tobacco: Never Used  . Alcohol Use: No  . Drug Use: No  . Sexual Activity: No   Other Topics Concern  . None   Social History Narrative   Married, 1 son (75 yo)one daughter (67) in 2014   Graded from [place]   Works as a tailor    Caffeine   02/20/2013   No regular exercise; diet if fair                    Current outpatient prescriptions:omeprazole (PRILOSEC) 40 MG capsule, Take 40 mg by mouth daily., Disp: , Rfl:   EXAM:  Filed Vitals:   02/27/14 0805  BP: 132/80  Pulse: 74  Temp: 97.9 F (36.6 C)    Body mass index is 21.92 kg/(m^2).  GENERAL: vitals reviewed and listed above, alert, oriented, appears well hydrated and in no acute distress  HEENT: atraumatic, conjunttiva clear, no obvious abnormalities on inspection of external nose and ears  NECK: no obvious masses on inspection  LUNGS: clear to  auscultation bilaterally, no wheezes, rales or rhonchi, good air movement  CV: HRRR, no peripheral edema  ABD: BS+, soft, nttp  MS: moves all extremities without noticeable abnormality  PSYCH: pleasant and cooperative, no obvious depression or anxiety  ASSESSMENT AND PLAN:  Discussed the following assessment and plan:  GERD (gastroesophageal reflux disease)  -interpreter present -we discussed possible serious and likely etiologies, workup and treatment, treatment risks and return precautions -of course, we advised [place]  to return or notify a doctor immediately if symptoms worsen or persist or new concerns arise.  .  -Patient advised to return or notify a doctor immediately if symptoms worsen or persist or new concerns arise.  Patient Instructions  -schedule physical exam in 2 months  -prilosec 20mg  daily, 40mg  for 3 days if you eat the wrong foods - follow up with your gastroenterologist if worsening or not improving  -please call you gastroenterologist and let them know it is time for you colonoscopy  -schedule your mammogram     Lucretia Kern

## 2014-04-30 ENCOUNTER — Ambulatory Visit (INDEPENDENT_AMBULATORY_CARE_PROVIDER_SITE_OTHER): Payer: BC Managed Care – PPO | Admitting: Family Medicine

## 2014-04-30 ENCOUNTER — Encounter: Payer: Self-pay | Admitting: Family Medicine

## 2014-04-30 VITALS — BP 100/62 | HR 70 | Temp 98.5°F | Ht 61.5 in | Wt 121.0 lb

## 2014-04-30 DIAGNOSIS — N951 Menopausal and female climacteric states: Secondary | ICD-10-CM

## 2014-04-30 DIAGNOSIS — Z0000 Encounter for general adult medical examination without abnormal findings: Secondary | ICD-10-CM

## 2014-04-30 DIAGNOSIS — K219 Gastro-esophageal reflux disease without esophagitis: Secondary | ICD-10-CM

## 2014-04-30 DIAGNOSIS — E785 Hyperlipidemia, unspecified: Secondary | ICD-10-CM

## 2014-04-30 LAB — LIPID PANEL
CHOL/HDL RATIO: 3
CHOLESTEROL: 191 mg/dL (ref 0–200)
HDL: 59.2 mg/dL (ref >39.00)
LDL CALC: 122 mg/dL — AB (ref 0–99)
NonHDL: 131.8
Triglycerides: 51 mg/dL (ref 0.0–149.0)
VLDL: 10.2 mg/dL (ref 0.0–40.0)

## 2014-04-30 LAB — HEMOGLOBIN A1C: HEMOGLOBIN A1C: 5.8 % (ref 4.6–6.5)

## 2014-04-30 NOTE — Progress Notes (Signed)
Pre visit review using our clinic review tool, if applicable. No additional management support is needed unless otherwise documented below in the visit note. 

## 2014-04-30 NOTE — Progress Notes (Signed)
No chief complaint on file.   HPI:  Here for CPE:  -Concerns today/Follow up:  GERD: -has seen GI for this in the past with EGD, taking nexium daily OTC -reports: if eats spicy foods get some heartburn -denies: dysphagia, weight loss, melena, diarrhea, fevers, vomiting  -Diet: variety of foods, balance and well rounded  -Vit D, Calcium: no  -Exercise: no regular exercise  -Diabetes and Dyslipidemia Screening: done lat year with mild dyslipidemia  -Hx of HTN: no  -Vaccines: UTD  -pap history: done with gyn - physical is scheduled in Nov, does pelvic and breast exam  -FDLMP: 3 months ago, perimenopausal, having some hot flashes but tolerable  -sexual activity: yes, female partner, no new partners  -wants STI testing: no  -FH breast, colon or ovarian ca: see FH -sees gyn for breast and cervical cancer screening -has not had colonoscopy  -Alcohol, Tobacco, drug use: see social history  Review of Systems - Review of Systems  Constitutional: Negative for fever, weight loss and malaise/fatigue.  HENT: Negative for hearing loss.   Eyes: Negative for blurred vision and double vision.  Respiratory: Negative for cough, sputum production and shortness of breath.   Cardiovascular: Negative for chest pain and palpitations.  Gastrointestinal: Negative for heartburn, nausea, blood in stool and melena.  Genitourinary: Negative for dysuria.  Musculoskeletal: Negative for falls and myalgias.  Skin: Negative for rash.  Neurological: Negative for dizziness and seizures.  Endo/Heme/Allergies: Does not bruise/bleed easily.  Psychiatric/Behavioral: Negative for depression and suicidal ideas.     Past Medical History  Diagnosis Date  . GERD (gastroesophageal reflux disease)   . Helicobacter pylori ab+ 2009    treated in [place]     Past Surgical History  Procedure Laterality Date  . Ovarian cyst surgery Bilateral   . Esophagogastroduodenoscopy  2009    [place] - ulcer scar  .  Colonoscopy  2009    [place] - ok  . Bartholin gland      No family history on file.  History   Social History  . Marital Status: Married    Spouse Name: N/A    Number of Children: 2  . Years of Education: N/A   Occupational History  . tailor    Social History Main Topics  . Smoking status: Never Smoker   . Smokeless tobacco: Never Used  . Alcohol Use: No  . Drug Use: No  . Sexual Activity: No   Other Topics Concern  . 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 moist mucus membranes.  NECK: supple, no masses or lymphadenopathy  LUNGS: clear to auscultation bilaterally, no rales, rhonchi or wheeze  CV: HRRR, no peripheral edema  or cyanosis, normal pedal pulses  BREAST: does with gyn  ABDOMEN: bowel sounds normal, soft, non tender to palpation, no masses, no rebound or guarding  GU: does with gyn  SKIN: no rash or abnormal lesions  MS: normal gait, moves all extremities normally  NEURO: CN II-XII grossly intact, normal muscle strength and sensation to light touch on extremities  PSYCH: normal affect, pleasant and cooperative  ASSESSMENT AND PLAN:  Discussed the following assessment and plan:  Visit for preventive health examination - Plan: Hemoglobin A1c  Gastroesophageal reflux disease without esophagitis -discussed diet  for GERD, trial off nexium if tolerates, follow up as needed  Perimenopause  Menopausal syndrome (hot flashes) -discussed lifestyle modification as mild at this time, she will follow up with gyn when has physical later this year, discussed tx options if worsen that she can discuss further with her gynecologist  Dyslipidemia - Plan: Lipid Panel -advised importance of at least 150-300 minutes of CV exercise and healthy diet  -Discussed and advised all [place] preventive services health task force level A and B recommendations for age, sex and risks.  -Advised at least 150 minutes of exercise per week and a healthy diet low in saturated fats and sweets and consisting of fresh fruits and vegetables, lean meats such as fish and white chicken and whole grains.  -FASTING labs, studies and vaccines per orders this encounter  Orders Placed This Encounter  Procedures  . Lipid Panel  . Hemoglobin A1c    Patient Instructions  -Vitamin D3 1000 IU; Calcium 1200mg  daily (but you likely get half of the calcium in the diet)  -We recommend the following healthy lifestyle measures: - eat a healthy diet consisting of lots of vegetables, fruits, beans, nuts, seeds, healthy meats such as white chicken and fish and whole grains.  - avoid fried foods, fast food, processed foods, sodas, red meet and other fattening foods.  - get a least 150 minutes of aerobic exercise per week.   -We have ordered labs or studies at this visit. It can take up to 1-2 weeks for results and processing. We will contact you with instructions IF your results are abnormal. Normal results will be released to your [facility]. If you have not heard from [place] or can not find your results in [facility] in 2 weeks please contact our office.  -see your gynecologist for the yearly breast exam and get mammogram yearly  -follow up yearly and as needed         Patient advised to return to clinic immediately if symptoms worsen or persist or new  concerns.    Return in about 1 year (around 05/01/2015).  Colin Benton R.

## 2014-04-30 NOTE — Patient Instructions (Signed)
-  Vitamin D3 1000 IU; Calcium 1200mg  daily (but you likely get half of the calcium in the diet)  -We recommend the following healthy lifestyle measures: - eat a healthy diet consisting of lots of vegetables, fruits, beans, nuts, seeds, healthy meats such as white chicken and fish and whole grains.  - avoid fried foods, fast food, processed foods, sodas, red meet and other fattening foods.  - get a least 150 minutes of aerobic exercise per week.   -We have ordered labs or studies at this visit. It can take up to 1-2 weeks for results and processing. We will contact you with instructions IF your results are abnormal. Normal results will be released to your [facility]. If you have not heard from [place] or can not find your results in [facility] in 2 weeks please contact our office.  -see your gynecologist for the yearly breast exam and get mammogram yearly  -follow up yearly and as needed

## 2014-08-06 ENCOUNTER — Ambulatory Visit (INDEPENDENT_AMBULATORY_CARE_PROVIDER_SITE_OTHER): Payer: BC Managed Care – PPO | Admitting: Family Medicine

## 2014-08-06 ENCOUNTER — Encounter: Payer: Self-pay | Admitting: Family Medicine

## 2014-08-06 VITALS — BP 100/62 | HR 86 | Temp 98.9°F | Ht 61.5 in | Wt 120.0 lb

## 2014-08-06 DIAGNOSIS — K219 Gastro-esophageal reflux disease without esophagitis: Secondary | ICD-10-CM

## 2014-08-06 MED ORDER — OMEPRAZOLE 40 MG PO CPDR: 40.0000 mg | DELAYED_RELEASE_CAPSULE | Freq: Every day | ORAL | Status: DC

## 2014-08-06 NOTE — Patient Instructions (Signed)
-  increase prilosec 40 mg daily for 1 month  -please follow the acid reflux diet  -if not resolved or other symptoms call your gastroenterologist for appointment  Food Choices for Gastroesophageal Reflux Disease When you have gastroesophageal reflux disease (GERD), the foods you eat and your eating habits are very important. Choosing the right foods can help ease the discomfort of GERD. WHAT GENERAL GUIDELINES DO I NEED TO FOLLOW?  Choose fruits, vegetables, whole grains, low-fat dairy products, and low-fat meat, fish, and poultry.  Limit fats such as oils, salad dressings, butter, nuts, and avocado.  Keep a food diary to identify foods that cause symptoms.  Avoid foods that cause reflux. These may be different for different people.  Eat frequent small meals instead of three large meals each day.  Eat your meals slowly, in a relaxed setting.  Limit fried foods.  Cook foods using methods other than frying.  Avoid drinking alcohol.  Avoid drinking large amounts of liquids with your meals.  Avoid bending over or lying down until 2-3 hours after eating. WHAT FOODS ARE NOT RECOMMENDED? The following are some foods and drinks that may worsen your symptoms: Vegetables Tomatoes. Tomato juice. Tomato and spaghetti sauce. Chili peppers. Onion and garlic. Horseradish. Fruits Oranges, grapefruit, and lemon (fruit and juice). Meats High-fat meats, fish, and poultry. This includes hot dogs, ribs, ham, sausage, salami, and bacon. Dairy Whole milk and chocolate milk. Sour cream. Cream. Butter. Ice cream. Cream cheese.  Beverages Coffee and tea, with or without caffeine. Carbonated beverages or energy drinks. Condiments Hot sauce. Barbecue sauce.  Sweets/Desserts Chocolate and cocoa. Donuts. Peppermint and spearmint. Fats and Oils High-fat foods, including [place] fries and potato chips. Other Vinegar. Strong spices, such as black pepper, white pepper, red pepper, cayenne, curry  powder, cloves, ginger, and chili powder. The items listed above may not be a complete list of foods and beverages to avoid. Contact your dietitian for more information. Document Released: 10/25/2005 Document Revised: 10/30/2013 Document Reviewed: 08/29/2013 [facility] Patient Information 2015 [place], [place]. This information is not intended to replace advice given to you by your health care provider. Make sure you discuss any questions you have with your health care provider.

## 2014-08-06 NOTE — Progress Notes (Signed)
No chief complaint on file.   HPI:  Acute visit for:  GERD/heartburn/epigastric discomfort: -restarted about 2 months ago -heartburn, pain in epigastric area  -has acid reflux every time she eats a large meal, pain abdomen - discomfort in epigastric region a few times per day -worse with spicy foods, large meals and acid reflux usually a few hours after dinner -saw GI for this last year and had EGD - normal, [place] with ? Prominent CBD, LFTs normal -reports: tried nexium and did not help, stopped nexium and trying prilosec the last 2 days and this is not helping either -denies: fevers, vomiting, nausea, hematochezia, melena, dysphagia -intermittent diarrhea -4 lb weight loss in 1 year  ROS: See pertinent positives and negatives per HPI.  Past Medical History  Diagnosis Date  . GERD (gastroesophageal reflux disease)   . Helicobacter pylori ab+ 2009    treated in [place]     Past Surgical History  Procedure Laterality Date  . Ovarian cyst surgery Bilateral   . Esophagogastroduodenoscopy  2009    [place] - ulcer scar  . Colonoscopy  2009    [place] - ok  . Bartholin gland      No family history on file.  History   Social History  . Marital Status: Married    Spouse Name: N/A    Number of Children: 2  . Years of Education: N/A   Occupational History  . tailor    Social History Main Topics  . Smoking status: Never Smoker   . Smokeless tobacco: Never Used  . Alcohol Use: No  . Drug Use: No  . Sexual Activity: No   Other Topics Concern  . None   Social History Narrative   Married, 1 son (68 yo)one daughter (66) in 2014   Graded from [place]   Works as a tailor    Caffeine   02/20/2013   No regular exercise; diet if fair                    Current outpatient prescriptions:omeprazole (PRILOSEC) 40 MG capsule, Take 1 capsule (40 mg total) by mouth daily., Disp: 30 capsule, Rfl: 1  EXAM:  Filed Vitals:   08/06/14 0906  BP: 100/62  Pulse: 86  Temp: 98.9  F (37.2 C)    Body mass index is 22.31 kg/(m^2).  GENERAL: vitals reviewed and listed above, alert, oriented, appears well hydrated and in no acute distress  HEENT: atraumatic, conjunttiva clear, no obvious abnormalities on inspection of external nose and ears  NECK: no obvious masses on inspection  LUNGS: clear to auscultation bilaterally, no wheezes, rales or rhonchi, good air movement  ABD: BS+, soft, NTTP, no rebound or guarding  CV: HRRR, no peripheral edema  MS: moves all extremities without noticeable abnormality  PSYCH: pleasant and cooperative, no obvious depression or anxiety  ASSESSMENT AND PLAN:  Discussed the following assessment and plan:  Gastroesophageal reflux disease, esophagitis presence not specified - Plan: omeprazole (PRILOSEC) 40 MG capsule  -we discussed possible serious and likely etiologies, workup and treatment, treatment risks and return precautions -after this discussion, Courtney Figueroa opted for fiet changes, 40mg  prilosec daily, see GI if not resolved in 2-4 weeks -of course, we advised Courtney Figueroa  to return or notify a doctor immediately if symptoms worsen or persist or new concerns arise.  -Patient advised to return or notify a doctor immediately if symptoms worsen or persist or new concerns arise.  Patient Instructions  -increase prilosec 40 mg daily  for 1 month  -please follow the acid reflux diet  -if not resolved or other symptoms call your gastroenterologist for appointment  Food Choices for Gastroesophageal Reflux Disease When you have gastroesophageal reflux disease (GERD), the foods you eat and your eating habits are very important. Choosing the right foods can help ease the discomfort of GERD. WHAT GENERAL GUIDELINES DO I NEED TO FOLLOW?  Choose fruits, vegetables, whole grains, low-fat dairy products, and low-fat meat, fish, and poultry.  Limit fats such as oils, salad dressings, butter, nuts, and avocado.  Keep a food diary to identify foods  that cause symptoms.  Avoid foods that cause reflux. These may be different for different people.  Eat frequent small meals instead of three large meals each day.  Eat your meals slowly, in a relaxed setting.  Limit fried foods.  Cook foods using methods other than frying.  Avoid drinking alcohol.  Avoid drinking large amounts of liquids with your meals.  Avoid bending over or lying down until 2-3 hours after eating. WHAT FOODS ARE NOT RECOMMENDED? The following are some foods and drinks that may worsen your symptoms: Vegetables Tomatoes. Tomato juice. Tomato and spaghetti sauce. Chili peppers. Onion and garlic. Horseradish. Fruits Oranges, grapefruit, and lemon (fruit and juice). Meats High-fat meats, fish, and poultry. This includes hot dogs, ribs, ham, sausage, salami, and bacon. Dairy Whole milk and chocolate milk. Sour cream. Cream. Butter. Ice cream. Cream cheese.  Beverages Coffee and tea, with or without caffeine. Carbonated beverages or energy drinks. Condiments Hot sauce. Barbecue sauce.  Sweets/Desserts Chocolate and cocoa. Donuts. Peppermint and spearmint. Fats and Oils High-fat foods, including [place] fries and potato chips. Other Vinegar. Strong spices, such as black pepper, white pepper, red pepper, cayenne, curry powder, cloves, ginger, and chili powder. The items listed above may not be a complete list of foods and beverages to avoid. Contact your dietitian for more information. Document Released: 10/25/2005 Document Revised: 10/30/2013 Document Reviewed: 08/29/2013 [facility] Patient Information 2015 [place], [place]. This information is not intended to replace advice given to you by your health care provider. Make sure you discuss any questions you have with your health care provider.      Colin Benton R.

## 2014-08-06 NOTE — Progress Notes (Signed)
Pre visit review using our clinic review tool, if applicable. No additional management support is needed unless otherwise documented below in the visit note. 

## 2014-09-09 ENCOUNTER — Encounter: Payer: Self-pay | Admitting: Family Medicine

## 2015-12-13 ENCOUNTER — Encounter: Payer: Self-pay | Admitting: *Deleted

## 2015-12-13 DIAGNOSIS — R4589 Other symptoms and signs involving emotional state: Secondary | ICD-10-CM

## 2015-12-13 NOTE — Congregational Nurse Program (Unsigned)
Congregational Nurse Program Note  Date of Encounter: 12/08/2015 Past Medical History: Past Medical History  Diagnosis Date  . GERD (gastroesophageal reflux disease)   . Helicobacter pylori ab+ 2009    treated in [place]     Encounter Details:     CNP Questionnaire - 12/08/15 2000    Patient Demographics   Is this a new or existing patient? Existing   Patient is considered a/an Not Applicable   Race Asian   Patient Assistance   Location of Patient Assistance --  home visit   Patient's financial/insurance status --  na   Uninsured Patient No   Patient referred to apply for the following financial assistance Not Applicable   Food insecurities addressed Not Applicable   Transportation assistance No   Assistance securing medications No   Educational health offerings Spiritual care   Encounter Details   Primary purpose of visit Spiritual Care/Support Visit   Was an Emergency Department visit averted? Not Applicable   Does patient have a medical provider? No   Patient referred to Not Applicable   Was a mental health screening completed? (GAINS tool) No   Does patient have dental issues? No   Since previous encounter, have you referred patient for abnormal blood pressure that resulted in a new diagnosis or medication change? No   Since previous encounter, have you referred patient for abnormal blood glucose that resulted in a new diagnosis or medication change? No   For Abstraction Use Only   Does patient have insurance? Yes     home visit for spiritual care

## 2016-01-14 ENCOUNTER — Ambulatory Visit (INDEPENDENT_AMBULATORY_CARE_PROVIDER_SITE_OTHER): Payer: BLUE CROSS/BLUE SHIELD | Admitting: Family Medicine

## 2016-01-14 ENCOUNTER — Encounter: Payer: Self-pay | Admitting: Family Medicine

## 2016-01-14 VITALS — BP 110/80 | HR 85 | Temp 98.3°F | Ht 61.5 in | Wt 119.7 lb

## 2016-01-14 DIAGNOSIS — R739 Hyperglycemia, unspecified: Secondary | ICD-10-CM

## 2016-01-14 DIAGNOSIS — K219 Gastro-esophageal reflux disease without esophagitis: Secondary | ICD-10-CM | POA: Diagnosis not present

## 2016-01-14 DIAGNOSIS — G8929 Other chronic pain: Secondary | ICD-10-CM | POA: Diagnosis not present

## 2016-01-14 DIAGNOSIS — R1011 Right upper quadrant pain: Secondary | ICD-10-CM

## 2016-01-14 DIAGNOSIS — D649 Anemia, unspecified: Secondary | ICD-10-CM

## 2016-01-14 DIAGNOSIS — R1013 Epigastric pain: Secondary | ICD-10-CM | POA: Diagnosis not present

## 2016-01-14 LAB — CBC WITH DIFFERENTIAL/PLATELET
BASOS ABS: 0.1 10*3/uL (ref 0.0–0.1)
Basophils Relative: 0.9 % (ref 0.0–3.0)
Eosinophils Absolute: 0.1 10*3/uL (ref 0.0–0.7)
Eosinophils Relative: 1.5 % (ref 0.0–5.0)
HEMATOCRIT: 35.7 % — AB (ref 36.0–46.0)
HEMOGLOBIN: 11.8 g/dL — AB (ref 12.0–15.0)
LYMPHS PCT: 30.3 % (ref 12.0–46.0)
Lymphs Abs: 1.8 10*3/uL (ref 0.7–4.0)
MCHC: 33.1 g/dL (ref 30.0–36.0)
MCV: 78.8 fl (ref 78.0–100.0)
MONOS PCT: 5.4 % (ref 3.0–12.0)
Monocytes Absolute: 0.3 10*3/uL (ref 0.1–1.0)
NEUTROS ABS: 3.8 10*3/uL (ref 1.4–7.7)
Neutrophils Relative %: 61.9 % (ref 43.0–77.0)
Platelets: 278 10*3/uL (ref 150.0–400.0)
RBC: 4.53 Mil/uL (ref 3.87–5.11)
RDW: 16.2 % — ABNORMAL HIGH (ref 11.5–15.5)
WBC: 6.1 10*3/uL (ref 4.0–10.5)

## 2016-01-14 NOTE — Progress Notes (Signed)
Pre visit review using our clinic review tool, if applicable. No additional management support is needed unless otherwise documented below in the visit note. 

## 2016-01-14 NOTE — Progress Notes (Signed)
HPI: Interpreter is present.  Courtney Figueroa is a pleasant 50 year old [place] woman here for an acute visit for right upper quadrant and epigastric discomfort. She has a remote history of treated H. pylori infection. She has had these symptoms on and off for many many years. She had an extensive evaluation in 2014 with lab work, an EGD and an ultrasound along with evaluation with a gastroenterologist. At the time it was felt that this was acid reflux and she was on a PPI. She reports she has had slight the increased frequency of these symptoms this past month. Can go several days without symptoms, and sometimes has symptoms several days per week. He describes mild-to-moderate discomfort in the upper right abdomen that can last for several hours at a time or all day. Some foods seem to worsen this including kimchi and mil she takes Prilosec sometimes and sometimes Nexium but not on a regular basis. She denies the use of aspirin or NSAIDs on a regular basis. She d or  she has occasional heartburn and reflux. She denies nausea, vomiting, dysphagia, change in bowels, diarrhea, fevers, weight loss, melena or hematochezia. she rarely will have a sharp pain in the right mid back that radiates to the side and only lasts a few seconds. RO . S: See pertinent positives and negatives per HPI.  Past Medical History  Diagnosis Date  . GERD (gastroesophageal reflux disease)   . Helicobacter pylori ab+ 2009    treated in [place]     Past Surgical History  Procedure Laterality Date  . Ovarian cyst surgery Bilateral   . Esophagogastroduodenoscopy  2009    [place] - ulcer scar  . Colonoscopy  2009    [place] - ok  . Bartholin gland      No family history on file.  Social History   Social History  . Marital Status: Married    Spouse Name: N/A  . Number of Children: 2  . Years of Education: N/A   Occupational History  . tailor    Social History Main Topics  . Smoking status: Never Smoker   . Smokeless tobacco:  Never Used  . Alcohol Use: No  . Drug Use: No  . Sexual Activity: No   Other Topics Concern  . 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  omeprazole (PRILOSEC) 40 MG capsule, Take 1 capsule (40 mg total) by mouth daily., Disp: 30 capsule, Rfl: 1  EXAM:  Filed Vitals:   01/14/16 0933  BP: 110/80  Pulse: 85  Temp: 98.3 F (36.8 C)    Body mass index is 22.25 kg/(m^2).  GENERAL: vitals reviewed and listed above, alert, oriented, appears well hydrated and in no acute distress  HEENT: atraumatic, conjunttiva clear, no obvious abnormalities on inspection of external nose and ears  NECK: no obvious masses on inspection  LUNGS: clear to auscultation bilaterally, no wheezes, rales or rhonchi, good air movement  CV: HRRR, no peripheral edema  ABD: BS+, soft, NTTP, no rebound or guarding  MS: moves all extremities without noticeable abnormality  PSYCH: pleasant and cooperative, no obvious depression or anxiety  ASSESSMENT AND PLAN:  >  25 minutes spent in caring for this patient with > 505 spent face to face in counseling. Discussed the following assessment and plan:  Abdominal pain, chronic, epigastric  RUQ abdominal pain - Plan: CBC with Differential, CMP with eGFR, Celiac Ab tTG DGP TIgA  Gastroesophageal reflux disease without esophagitis  -Has had these symptoms for many years and has had an extensive evaluation in the past that visit labs, ultrasound, EGD and gastroenterology evaluation. It was determined that she had acid reflux. -We'll check basic lab work and celiac screening today. -Restart daily Nexium. -Needs physical and we'll plan follow-up physical in about a month. -If labs are unrevealing and she has persistent issues despite PPI use will have  her see GI. -Patient advised to return or notify a doctor immediately if symptoms worsen or persist or new concerns arise.  Patient Instructions  Before you leave: -Schedule your physical exam and follow up in 1-3 months -please come fasting but drink plenty of water that day. -Labs  Please take over-the-counter Nexium once daily.  Please follow the dietary recommendations below.  Follow-up sooner if symptoms are worsening or new symptoms develop.  Food Choices for Gastroesophageal Reflux Disease, Adult When you have gastroesophageal reflux disease (GERD), the foods you eat and your eating habits are very important. Choosing the right foods can help ease the discomfort of GERD. WHAT GENERAL GUIDELINES DO I NEED TO FOLLOW?  Choose fruits, vegetables, whole grains, low-fat dairy products, and low-fat meat, fish, and poultry.  Limit fats such as oils, salad dressings, butter, nuts, and avocado.  Keep a food diary to identify foods that cause symptoms.  Avoid foods that cause reflux. These may be different for different people.  Eat frequent small meals instead of three large meals each day.  Eat your meals slowly, in a relaxed setting.  Limit fried foods.  Cook foods using methods other than frying.  Avoid drinking alcohol.  Avoid drinking large amounts of liquids with your meals.  Avoid bending over or lying down until 2-3 hours after eating. WHAT FOODS ARE NOT RECOMMENDED? The following are some foods and drinks that may worsen your symptoms: Vegetables Tomatoes. Tomato juice. Tomato and spaghetti sauce. Chili peppers. Onion and garlic. Horseradish. Fruits Oranges, grapefruit, and lemon (fruit and juice). Meats High-fat meats, fish, and poultry. This includes hot dogs, ribs, ham, sausage, salami, and bacon. Dairy Whole milk and chocolate milk. Sour cream. Cream. Butter. Ice cream. Cream cheese.  Beverages Coffee and tea, with or without caffeine. Carbonated beverages  or energy drinks. Condiments Hot sauce. Barbecue sauce.  Sweets/Desserts Chocolate and cocoa. Donuts. Peppermint and spearmint. Fats and Oils High-fat foods, including [place] fries and potato chips. Other Vinegar. Strong spices, such as black pepper, white pepper, red pepper, cayenne, curry powder, cloves, ginger, and chili powder. The items listed above may not be a complete list of foods and beverages to avoid. Contact your dietitian for more information.   This information is not intended to replace advice given to you by your health care provider. Make sure you discuss any questions you have with your health care provider.   Document Released: 10/25/2005 Document Revised: 11/15/2014 Document Reviewed: 08/29/2013 Elsevier Interactive Patient Education 2016 [place], [place]

## 2016-01-14 NOTE — Patient Instructions (Signed)
Before you leave: -Schedule your physical exam and follow up in 1-3 months -please come fasting but drink plenty of water that day. -Labs  Please take over-the-counter Nexium once daily.  Please follow the dietary recommendations below.  Follow-up sooner if symptoms are worsening or new symptoms develop.  Food Choices for Gastroesophageal Reflux Disease, Adult When you have gastroesophageal reflux disease (GERD), the foods you eat and your eating habits are very important. Choosing the right foods can help ease the discomfort of GERD. WHAT GENERAL GUIDELINES DO I NEED TO FOLLOW?  Choose fruits, vegetables, whole grains, low-fat dairy products, and low-fat meat, fish, and poultry.  Limit fats such as oils, salad dressings, butter, nuts, and avocado.  Keep a food diary to identify foods that cause symptoms.  Avoid foods that cause reflux. These may be different for different people.  Eat frequent small meals instead of three large meals each day.  Eat your meals slowly, in a relaxed setting.  Limit fried foods.  Cook foods using methods other than frying.  Avoid drinking alcohol.  Avoid drinking large amounts of liquids with your meals.  Avoid bending over or lying down until 2-3 hours after eating. WHAT FOODS ARE NOT RECOMMENDED? The following are some foods and drinks that may worsen your symptoms: Vegetables Tomatoes. Tomato juice. Tomato and spaghetti sauce. Chili peppers. Onion and garlic. Horseradish. Fruits Oranges, grapefruit, and lemon (fruit and juice). Meats High-fat meats, fish, and poultry. This includes hot dogs, ribs, ham, sausage, salami, and bacon. Dairy Whole milk and chocolate milk. Sour cream. Cream. Butter. Ice cream. Cream cheese.  Beverages Coffee and tea, with or without caffeine. Carbonated beverages or energy drinks. Condiments Hot sauce. Barbecue sauce.  Sweets/Desserts Chocolate and cocoa. Donuts. Peppermint and spearmint. Fats and  Oils High-fat foods, including [place] fries and potato chips. Other Vinegar. Strong spices, such as black pepper, white pepper, red pepper, cayenne, curry powder, cloves, ginger, and chili powder. The items listed above may not be a complete list of foods and beverages to avoid. Contact your dietitian for more information.   This information is not intended to replace advice given to you by your health care provider. Make sure you discuss any questions you have with your health care provider.   Document Released: 10/25/2005 Document Revised: 11/15/2014 Document Reviewed: 08/29/2013 Elsevier Interactive Patient Education [employer].

## 2016-01-15 LAB — COMPLETE METABOLIC PANEL WITH GFR
ALK PHOS: 71 U/L (ref 33–130)
ALT: 12 U/L (ref 6–29)
AST: 17 U/L (ref 10–35)
Albumin: 4.2 g/dL (ref 3.6–5.1)
BILIRUBIN TOTAL: 0.4 mg/dL (ref 0.2–1.2)
BUN: 15 mg/dL (ref 7–25)
CO2: 22 mmol/L (ref 20–31)
CREATININE: 0.79 mg/dL (ref 0.50–1.05)
Calcium: 9.5 mg/dL (ref 8.6–10.4)
Chloride: 105 mmol/L (ref 98–110)
GFR, Est Non African American: 86 mL/min (ref >=60)
Glucose, Bld: 139 mg/dL — ABNORMAL HIGH (ref 65–99)
Potassium: 3.9 mmol/L (ref 3.5–5.3)
Sodium: 141 mmol/L (ref 135–146)
TOTAL PROTEIN: 7.2 g/dL (ref 6.1–8.1)

## 2016-01-21 NOTE — Addendum Note (Signed)
Addended by: Agnes Lawrence on: 01/21/2016 12:54 PM   Modules accepted: Orders

## 2016-02-17 ENCOUNTER — Other Ambulatory Visit (INDEPENDENT_AMBULATORY_CARE_PROVIDER_SITE_OTHER): Payer: BLUE CROSS/BLUE SHIELD

## 2016-02-17 DIAGNOSIS — R739 Hyperglycemia, unspecified: Secondary | ICD-10-CM

## 2016-02-17 DIAGNOSIS — R1084 Generalized abdominal pain: Secondary | ICD-10-CM

## 2016-02-17 DIAGNOSIS — D649 Anemia, unspecified: Secondary | ICD-10-CM | POA: Diagnosis not present

## 2016-02-17 LAB — CBC WITH DIFFERENTIAL/PLATELET
BASOS ABS: 0.1 10*3/uL (ref 0.0–0.1)
Basophils Relative: 1.1 % (ref 0.0–3.0)
EOS ABS: 0.1 10*3/uL (ref 0.0–0.7)
Eosinophils Relative: 1.7 % (ref 0.0–5.0)
HEMATOCRIT: 35.9 % — AB (ref 36.0–46.0)
Hemoglobin: 11.9 g/dL — ABNORMAL LOW (ref 12.0–15.0)
LYMPHS PCT: 36.8 % (ref 12.0–46.0)
Lymphs Abs: 2.1 10*3/uL (ref 0.7–4.0)
MCHC: 33.2 g/dL (ref 30.0–36.0)
MCV: 79.1 fl (ref 78.0–100.0)
Monocytes Absolute: 0.4 10*3/uL (ref 0.1–1.0)
Monocytes Relative: 6.8 % (ref 3.0–12.0)
NEUTROS ABS: 3 10*3/uL (ref 1.4–7.7)
NEUTROS PCT: 53.6 % (ref 43.0–77.0)
PLATELETS: 272 10*3/uL (ref 150.0–400.0)
RBC: 4.53 Mil/uL (ref 3.87–5.11)
RDW: 16.4 % — ABNORMAL HIGH (ref 11.5–15.5)
WBC: 5.7 10*3/uL (ref 4.0–10.5)

## 2016-02-17 LAB — IBC PANEL
Iron: 39 ug/dL — ABNORMAL LOW (ref 42–145)
SATURATION RATIOS: 8.3 % — AB (ref 20.0–50.0)
TRANSFERRIN: 335 mg/dL (ref 212.0–360.0)

## 2016-02-17 LAB — HEMOGLOBIN A1C: Hgb A1c MFr Bld: 5.9 % (ref 4.6–6.5)

## 2016-03-05 ENCOUNTER — Encounter: Payer: Self-pay | Admitting: Physician Assistant

## 2016-03-05 ENCOUNTER — Encounter: Payer: Self-pay | Admitting: *Deleted

## 2016-03-17 ENCOUNTER — Ambulatory Visit: Payer: BLUE CROSS/BLUE SHIELD | Admitting: Physician Assistant

## 2016-03-21 LAB — POCT LIPID PANEL
HDL: 58
LDL: 79
TC: 155
TRG: 92

## 2016-03-21 LAB — GLUCOSE, POCT (MANUAL RESULT ENTRY): POC Glucose: 92 mg/dl (ref 70–99)

## 2016-04-14 ENCOUNTER — Encounter: Payer: BLUE CROSS/BLUE SHIELD | Admitting: Family Medicine

## 2016-04-15 ENCOUNTER — Ambulatory Visit (INDEPENDENT_AMBULATORY_CARE_PROVIDER_SITE_OTHER): Payer: BLUE CROSS/BLUE SHIELD | Admitting: Family Medicine

## 2016-04-15 DIAGNOSIS — R69 Illness, unspecified: Secondary | ICD-10-CM

## 2016-04-15 NOTE — Progress Notes (Signed)
NO SHOW

## 2016-08-17 ENCOUNTER — Encounter: Payer: Self-pay | Admitting: *Deleted

## 2016-08-24 LAB — GLUCOSE, POCT (MANUAL RESULT ENTRY): POC GLUCOSE: 141 mg/dL — AB (ref 70–99)

## 2016-09-02 ENCOUNTER — Encounter: Payer: Self-pay | Admitting: Family Medicine

## 2016-09-02 ENCOUNTER — Ambulatory Visit (INDEPENDENT_AMBULATORY_CARE_PROVIDER_SITE_OTHER): Payer: BLUE CROSS/BLUE SHIELD | Admitting: Family Medicine

## 2016-09-02 VITALS — BP 118/80 | HR 88 | Temp 98.0°F | Ht 62.0 in | Wt 114.7 lb

## 2016-09-02 DIAGNOSIS — G8929 Other chronic pain: Secondary | ICD-10-CM | POA: Diagnosis not present

## 2016-09-02 DIAGNOSIS — K30 Functional dyspepsia: Secondary | ICD-10-CM | POA: Diagnosis not present

## 2016-09-02 DIAGNOSIS — R1011 Right upper quadrant pain: Secondary | ICD-10-CM

## 2016-09-02 DIAGNOSIS — M546 Pain in thoracic spine: Secondary | ICD-10-CM | POA: Diagnosis not present

## 2016-09-02 LAB — CBC WITH DIFFERENTIAL/PLATELET
BASOS ABS: 0 10*3/uL (ref 0.0–0.1)
Basophils Relative: 0.7 % (ref 0.0–3.0)
EOS ABS: 0 10*3/uL (ref 0.0–0.7)
Eosinophils Relative: 0.4 % (ref 0.0–5.0)
HCT: 39.4 % (ref 36.0–46.0)
Hemoglobin: 13.3 g/dL (ref 12.0–15.0)
LYMPHS ABS: 1.6 10*3/uL (ref 0.7–4.0)
Lymphocytes Relative: 23.3 % (ref 12.0–46.0)
MCHC: 33.7 g/dL (ref 30.0–36.0)
MCV: 84 fl (ref 78.0–100.0)
MONOS PCT: 5.6 % (ref 3.0–12.0)
Monocytes Absolute: 0.4 10*3/uL (ref 0.1–1.0)
NEUTROS ABS: 4.9 10*3/uL (ref 1.4–7.7)
NEUTROS PCT: 70 % (ref 43.0–77.0)
PLATELETS: 250 10*3/uL (ref 150.0–400.0)
RBC: 4.69 Mil/uL (ref 3.87–5.11)
RDW: 14.2 % (ref 11.5–15.5)
WBC: 7 10*3/uL (ref 4.0–10.5)

## 2016-09-02 LAB — COMPREHENSIVE METABOLIC PANEL
ALBUMIN: 4.6 g/dL (ref 3.5–5.2)
ALT: 14 U/L (ref 0–35)
AST: 17 U/L (ref 0–37)
Alkaline Phosphatase: 78 U/L (ref 39–117)
BUN: 11 mg/dL (ref 6–23)
CHLORIDE: 102 meq/L (ref 96–112)
CO2: 31 meq/L (ref 19–32)
CREATININE: 0.76 mg/dL (ref 0.40–1.20)
Calcium: 10 mg/dL (ref 8.4–10.5)
GFR: 84.71 mL/min (ref >60.00)
GLUCOSE: 107 mg/dL — AB (ref 70–99)
POTASSIUM: 4 meq/L (ref 3.5–5.1)
SODIUM: 141 meq/L (ref 135–145)
Total Bilirubin: 0.7 mg/dL (ref 0.2–1.2)
Total Protein: 7.6 g/dL (ref 6.0–8.3)

## 2016-09-02 LAB — LIPASE: LIPASE: 9 U/L — AB (ref 11.0–59.0)

## 2016-09-02 LAB — HEMOGLOBIN A1C: HEMOGLOBIN A1C: 5.5 % (ref 4.6–6.5)

## 2016-09-02 NOTE — Progress Notes (Signed)
Pre visit review using our clinic review tool, if applicable. No additional management support is needed unless otherwise documented below in the visit note. 

## 2016-09-02 NOTE — Progress Notes (Signed)
HPI:  Acute visit for several issues:  Chronic Indigestion/RUQ abd pain: -for many years -s/p remote GI evaluation -symptoms include indigestion/sometimes severe, RUQ abd pain intermittent/crampy, feels nausea and sometimes fatigue right before eating then improves with eating -denies: fevers, malaise, vomiting, dysphagia, change in bowels, melena, hematochezia, unexplained wt loss, SOB, chest pain, GU complaints -presented with same in April, mild anemia, advised re-eval with GI - she canceled that appt and did not follow up with [place] as advised -she is worried about GB, diabetes and the pancreas after reading on the Internet -s/p nromal EGD and abd [place] with widened CMD but otherwise normal in 2014 with GI -was seeing gyn for women's physicals and pelvic exams  R upper back pain: -started after mechanical fall about 1-2 months ago and caught herself with the R arm -pain in R mid to upper thoracic region with certain activities -this pain comes and goes and occurs a few times per month, it is mild to mod -it is relieved by massage -she has not tried any medication for this -denies: fevers, malaise, weakness, radiation of pain, numbness  ROS: See pertinent positives and negatives per HPI.  Past Medical History:  Diagnosis Date  . GERD (gastroesophageal reflux disease)   . Helicobacter pylori ab+ 2009   treated in [place]     Past Surgical History:  Procedure Laterality Date  . bartholin gland    . COLONOSCOPY  2009   [place] - ok  . ESOPHAGOGASTRODUODENOSCOPY  2009   [place] - ulcer scar  . OVARIAN CYST SURGERY Bilateral     No family history on file.  Social History   Social History  . Marital status: Married    Spouse name: N/A  . Number of children: 2  . Years of education: N/A   Occupational History  . tailor [employer]   Social History Main Topics  . Smoking status: Never Smoker  . Smokeless tobacco: Never Used  . Alcohol use No  . Drug use: No  . Sexual  activity: No   Other Topics Concern  . 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  Esomeprazole Magnesium (NEXIUM PO), Take by mouth., Disp: , Rfl:   EXAM:  Vitals:   09/02/16 1007  BP: 118/80  Pulse: 88  Temp: 98 F (36.7 C)    Body mass index is 20.98 kg/m.  GENERAL: vitals reviewed and listed above, alert, oriented, appears well hydrated and in no acute distress  HEENT: atraumatic, conjunttiva clear, no obvious abnormalities on inspection of external nose and ears  NECK: no obvious masses on inspection  LUNGS: clear to auscultation bilaterally, no wheezes, rales or rhonchi, good air movement  CV: HRRR, no peripheral edema  ABD: BS+, soft, minimal TTP in the epigastric/RUQ, no rebound or guarding  MS: moves all extremities without noticeable abnormality, normal inspection of the back and neck but with mild ant placement of the head and neck due to poor posture. TTP with pain reproduced on palpation of the R rhomboid muscles, no bony TTP.   PSYCH: pleasant and cooperative, no obvious depression or anxiety  ASSESSMENT AND PLAN:  Discussed the  following assessment and plan:  Right-sided thoracic back pain, unspecified chronicity  Abdominal pain, chronic, right upper quadrant - Plan: CBC with Differential/Platelets, CMP with eGFR, Lipase, US Abdomen Complete  Indigestion - Plan: Hemoglobin A1c, US Abdomen Complete  -I think the back issue is likely muscular - discussed other potential causes and opted to initiate HEP and symptomatic care with close follow up -I advised she really does need to follow up with her gastroenterologist about the chronic nausea, indigestion, anemia and abd pain - we will go ahead and repeat labs and [place] today to initiate evaluation.  Explained she may need CT scan and/or endoscopy as well if persistent issues. -preventive visit and follow up in 1 month - will need to ensure she is getting routine pelvic exams with her gynecologist as well -Patient advised to return or notify a doctor immediately if symptoms worsen or persist or new concerns arise.  Patient Instructions  BEFORE YOU LEAVE: -follow up:  Physical Exam in 1 month -upper back exercises -number for the gastroenterologist -labs  Call today to set up your mammogram  For the Back Pain: -do the exercises 4 days per week -can use the following for pain as needed: heat, topical over the counter sports creams with menthol or capsaicin, tylenol per instructions  For the Indigestion and Abdominal Pain: -CALL the gastroenterology office today to set up an appointment In the interim: - diet per below - prilosec 31m daily (this is available over the counter) - we ordered labs and an ultrasound  Food Choices for Gastroesophageal Reflux Disease, Adult When you have gastroesophageal reflux disease (GERD), the foods you eat and your eating habits are very important. Choosing the right foods can help ease the discomfort of GERD. WHAT GENERAL GUIDELINES DO I NEED TO FOLLOW?  Choose fruits, vegetables, whole grains, low-fat dairy products, and low-fat meat, fish, and poultry.  Limit fats such as oils, salad dressings, butter, nuts, and avocado.  Keep a food diary to identify foods that cause symptoms.  Avoid foods that cause reflux. These may be different for different people.  Eat frequent small meals instead of three large meals each day.  Eat your meals slowly, in a relaxed setting.  Limit fried foods.  Cook foods using methods other than frying.  Avoid drinking alcohol.  Avoid drinking large amounts of liquids with your meals.  Avoid bending over or lying down until 2-3 hours after eating. WHAT FOODS ARE NOT RECOMMENDED? The following are some foods  and drinks that may worsen your symptoms: Vegetables Tomatoes. Tomato juice. Tomato and spaghetti sauce. Chili peppers. Onion and garlic. Horseradish. Fruits Oranges, grapefruit, and lemon (fruit and juice). Meats High-fat meats, fish, and poultry. This includes hot dogs, ribs, ham, sausage, salami, and bacon. Dairy Whole milk and chocolate milk. Sour cream. Cream. Butter. Ice cream. Cream cheese.  Beverages Coffee and tea, with or without caffeine. Carbonated beverages or energy drinks. Condiments Hot sauce. Barbecue sauce.  Sweets/Desserts Chocolate and cocoa. Donuts. Peppermint and spearmint. Fats and Oils High-fat foods, including [place] and potato chips. Other Vinegar. Strong spices, such as black pepper, white pepper, red pepper, cayenne, curry powder, cloves, ginger, and chili powder. The items listed above may not be a complete list of foods and beverages to avoid. Contact your dietitian for more information.   This information is not intended to replace advice given to you by your health care provider. Make sure you discuss any questions you have with your health care provider.  Document Released: 10/25/2005 Document Revised: 11/15/2014 Document Reviewed: 08/29/2013 Elsevier Interactive Patient Education 2016 [place]., DO

## 2016-09-02 NOTE — Patient Instructions (Addendum)
BEFORE YOU LEAVE: -follow up:  Physical Exam in 1 month -upper back exercises -number for the gastroenterologist -labs  Call today to set up your mammogram  For the Back Pain: -do the exercises 4 days per week -can use the following for pain as needed: heat, topical over the counter sports creams with menthol or capsaicin, tylenol per instructions  For the Indigestion and Abdominal Pain: -CALL the gastroenterology office today to set up an appointment In the interim: - diet per below - prilosec 20mg  daily (this is available over the counter) - we ordered labs and an ultrasound  Food Choices for Gastroesophageal Reflux Disease, Adult When you have gastroesophageal reflux disease (GERD), the foods you eat and your eating habits are very important. Choosing the right foods can help ease the discomfort of GERD. WHAT GENERAL GUIDELINES DO I NEED TO FOLLOW?  Choose fruits, vegetables, whole grains, low-fat dairy products, and low-fat meat, fish, and poultry.  Limit fats such as oils, salad dressings, butter, nuts, and avocado.  Keep a food diary to identify foods that cause symptoms.  Avoid foods that cause reflux. These may be different for different people.  Eat frequent small meals instead of three large meals each day.  Eat your meals slowly, in a relaxed setting.  Limit fried foods.  Cook foods using methods other than frying.  Avoid drinking alcohol.  Avoid drinking large amounts of liquids with your meals.  Avoid bending over or lying down until 2-3 hours after eating. WHAT FOODS ARE NOT RECOMMENDED? The following are some foods and drinks that may worsen your symptoms: Vegetables Tomatoes. Tomato juice. Tomato and spaghetti sauce. Chili peppers. Onion and garlic. Horseradish. Fruits Oranges, grapefruit, and lemon (fruit and juice). Meats High-fat meats, fish, and poultry. This includes hot dogs, ribs, ham, sausage, salami, and bacon. Dairy Whole milk and  chocolate milk. Sour cream. Cream. Butter. Ice cream. Cream cheese.  Beverages Coffee and tea, with or without caffeine. Carbonated beverages or energy drinks. Condiments Hot sauce. Barbecue sauce.  Sweets/Desserts Chocolate and cocoa. Donuts. Peppermint and spearmint. Fats and Oils High-fat foods, including [place] fries and potato chips. Other Vinegar. Strong spices, such as black pepper, white pepper, red pepper, cayenne, curry powder, cloves, ginger, and chili powder. The items listed above may not be a complete list of foods and beverages to avoid. Contact your dietitian for more information.   This information is not intended to replace advice given to you by your health care provider. Make sure you discuss any questions you have with your health care provider.   Document Released: 10/25/2005 Document Revised: 11/15/2014 Document Reviewed: 08/29/2013 Elsevier Interactive Patient Education [employer].

## 2016-09-02 NOTE — Addendum Note (Signed)
Addended by: Gari Crown D on: 09/02/2016 11:04 AM   Modules accepted: Orders

## 2016-09-05 ENCOUNTER — Encounter: Payer: Self-pay | Admitting: *Deleted

## 2016-09-06 ENCOUNTER — Encounter: Payer: Self-pay | Admitting: Internal Medicine

## 2016-09-06 ENCOUNTER — Ambulatory Visit (INDEPENDENT_AMBULATORY_CARE_PROVIDER_SITE_OTHER): Payer: BLUE CROSS/BLUE SHIELD | Admitting: Internal Medicine

## 2016-09-06 VITALS — BP 136/86 | HR 76 | Ht 61.0 in | Wt 116.2 lb

## 2016-09-06 DIAGNOSIS — R1013 Epigastric pain: Secondary | ICD-10-CM

## 2016-09-06 DIAGNOSIS — R1011 Right upper quadrant pain: Secondary | ICD-10-CM | POA: Diagnosis not present

## 2016-09-06 MED ORDER — DICYCLOMINE HCL 10 MG PO CAPS: 10.0000 mg | ORAL_CAPSULE | Freq: Four times a day (QID) | ORAL | 0 refills | Status: DC | PRN

## 2016-09-06 NOTE — Patient Instructions (Signed)
We have sent the following medications to your pharmacy for you to pick up at your convenience: Bentyl.   We will follow-up after your results come back from your ultrasound that your primary care physician has ordered.

## 2016-09-06 NOTE — Progress Notes (Signed)
Courtney Figueroa 50 y.o. July 29, 1966 [id number] Referred by: Lucretia Kern, DO  Assessment & Plan:   Encounter Diagnoses  Name Primary?  . RUQ abdominal pain Yes  . Abdominal pain, epigastric    Sounds functional and musculoskeletal Will try dicyclomine prn Wait on [place] Massage Tx reasonable for back and deck  CT might be needed if persists - could need that much reassurance  No worrisome features Had EGD 2014 Labs ok  Will let her f/u PCP next and if I need to be involved further will arrange Reassured as best I can today  I appreciate the opportunity to care for you. Cc:KIM, Nickola Major., DO    Subjective:   Chief Complaint: abdominal pain  HPI Very nice [place] woman here with friend (Mrs. Sheria Lang) and interpreter - having abdominal and back pain issues. Hx well summarized in recent PCP note 10/26 - patient fell and injured back in summer and has had some pains there since - right scapular area. Also having intermittent 5-10 minute epigastric and RUQ pains described as "spasms" Takes PPI most days . Had workup by me in 2014 - neg labs, [place] and EGD. On PPI since. Did have slightly prominent CBD on [place].  Labs bt Dr. Maudie Mercury last week - NL CMET, CBC and lipase. No weight loss. Appetite is good.regular stools w/o problems. Negative colonoscopy 2009 by patient report Elonda Husky)  No Known Allergies Outpatient Medications Prior to Visit  Medication Sig Dispense Refill  . Esomeprazole Magnesium (NEXIUM PO) Take by mouth.     No facility-administered medications prior to visit.    Past Medical History:  Diagnosis Date  . GERD (gastroesophageal reflux disease)   . Helicobacter pylori ab+ 2009   treated in [place]    Past Surgical History:  Procedure Laterality Date  . bartholin gland    . COLONOSCOPY  2009   [place] - ok  . ESOPHAGOGASTRODUODENOSCOPY  2009   [place] - ulcer scar  . OVARIAN CYST SURGERY Bilateral    Social History   Social History  . Marital status: Married    Spouse  name: N/A  . Number of children: 2  . Years of education: N/A   Occupational History  . tailor [employer]   Social History Main Topics  . Smoking status: Never Smoker  . Smokeless tobacco: Never Used  . Alcohol use No  . Drug use: No  . Sexual activity: No   Other Topics Concern  . None   Social History Narrative   Married, 1 son (44 yo)one daughter (50) in 2014   Graded from [place]   Works as a tailor    Caffeine   02/20/2013   No regular exercise; diet if fair                   NO FHX GI problems reported today   Review of Systems Some neck pain, stiffness All other ROS neg  Objective:   Physical Exam @BP  136/86 (BP Location: Right Arm, Patient Position: Sitting, Cuff Size: Normal)   Pulse 76   Ht 5\' 1"  (1.549 m)   Wt 116 lb 4 oz (52.7 kg)   LMP 02/09/2013   BMI 21.97 kg/m @  General:  Well-developed, well-nourished and in no acute distress Eyes:  anicteric. ENT:   Mouth and posterior pharynx free of lesions.  Neck:   supple w/o thyromegaly or mass.  Lungs: Clear to auscultation bilaterally. Heart:  S1S2, no rubs, murmurs, gallops. Abdomen:  soft, non-tender, no hepatosplenomegaly, hernia, or mass and BS+.  Lymph:  no cervical or supraclavicular adenopathy. Extremities:   no edema, cyanosis or clubbing Skin   no rash. Psych:  appropriate mood and  Affect.   Data Reviewed: Wt Readings from Last 3 Encounters:  09/06/16 116 lb 4 oz (52.7 kg)  09/02/16 114 lb 11.2 oz (52 kg)  03/21/16 117 lb (53.1 kg)   PCP notes 2017 GI notes 2014 Labs as above See HPI

## 2016-09-12 ENCOUNTER — Encounter: Payer: Self-pay | Admitting: *Deleted

## 2016-09-12 DIAGNOSIS — Z139 Encounter for screening, unspecified: Secondary | ICD-10-CM

## 2016-09-12 NOTE — Congregational Nurse Program (Signed)
Congregational Nurse Program Note  Date of Encounter: 08/24/2016 Past Medical History: No past medical history on file.  Encounter Details:     CNP Questionnaire - 08/24/16 1900      Patient Demographics   Is this a new or existing patient? Existing   Patient is considered a/an Immigrant   Race Asian     Patient Assistance   Location of Patient Assistance Not Applicable  [facility]   Patient's financial/insurance status Affordable Care Act   Uninsured Patient ([place] Card/Care Connects) No   Patient referred to apply for the following financial assistance Not Applicable   Food insecurities addressed Not Applicable   Transportation assistance No   Assistance securing medications No   Educational health offerings Diabetes     Encounter Details   Primary purpose of visit Education/Health Concerns;Spiritual Care/Support Visit   Was an Emergency Department visit averted? Not Applicable   Does patient have a medical provider? Yes   Patient referred to Clinic   Was a mental health screening completed? (GAINS tool) No   Does patient have dental issues? No   Does patient have vision issues? No   Does your patient have an abnormal blood pressure today? No  NA   Since previous encounter, have you referred patient for abnormal blood pressure that resulted in a new diagnosis or medication change? No   Does your patient have an abnormal blood glucose today? Yes  141   Since previous encounter, have you referred patient for abnormal blood glucose that resulted in a new diagnosis or medication change? No   Was there a life-saving intervention made? No     c/o pain at Rt. Scapular area along to ribs, stomach upset, hypoglycemic episode. CBG checked 141, ate dinner 1 hour ago. Explain hypoglycemic sx. Referred to see MD for pain and indigestion. Will f/u

## 2016-09-14 ENCOUNTER — Ambulatory Visit
Admission: RE | Admit: 2016-09-14 | Discharge: 2016-09-14 | Disposition: A | Discharge status: Home / Self Care | Payer: BLUE CROSS/BLUE SHIELD | Source: Ambulatory Visit | Attending: Family Medicine | Admitting: Family Medicine

## 2016-09-14 DIAGNOSIS — R1011 Right upper quadrant pain: Principal | ICD-10-CM

## 2016-09-14 DIAGNOSIS — G8929 Other chronic pain: Secondary | ICD-10-CM

## 2016-09-14 DIAGNOSIS — K30 Functional dyspepsia: Secondary | ICD-10-CM

## 2016-09-16 ENCOUNTER — Other Ambulatory Visit (HOSPITAL_COMMUNITY)
Admission: RE | Admit: 2016-09-16 | Discharge: 2016-09-16 | Disposition: A | Discharge status: Home / Self Care | Payer: BLUE CROSS/BLUE SHIELD | Source: Ambulatory Visit | Attending: Family Medicine | Admitting: Family Medicine

## 2016-09-16 ENCOUNTER — Encounter: Payer: Self-pay | Admitting: Family Medicine

## 2016-09-16 ENCOUNTER — Ambulatory Visit (INDEPENDENT_AMBULATORY_CARE_PROVIDER_SITE_OTHER): Payer: BLUE CROSS/BLUE SHIELD | Admitting: Family Medicine

## 2016-09-16 VITALS — BP 100/72 | HR 81 | Temp 98.5°F | Ht 60.75 in | Wt 118.0 lb

## 2016-09-16 DIAGNOSIS — Z124 Encounter for screening for malignant neoplasm of cervix: Secondary | ICD-10-CM | POA: Diagnosis not present

## 2016-09-16 DIAGNOSIS — Z01419 Encounter for gynecological examination (general) (routine) without abnormal findings: Secondary | ICD-10-CM | POA: Diagnosis not present

## 2016-09-16 DIAGNOSIS — Z0000 Encounter for general adult medical examination without abnormal findings: Secondary | ICD-10-CM | POA: Diagnosis not present

## 2016-09-16 DIAGNOSIS — Z1151 Encounter for screening for human papillomavirus (HPV): Secondary | ICD-10-CM | POA: Diagnosis present

## 2016-09-16 NOTE — Addendum Note (Signed)
Addended by: Agnes Lawrence on: 09/16/2016 03:33 PM   Modules accepted: Orders

## 2016-09-16 NOTE — Progress Notes (Signed)
Pre visit review using our clinic review tool, if applicable. No additional management support is needed unless otherwise documented below in the visit note. 

## 2016-09-16 NOTE — Patient Instructions (Addendum)
BEFORE YOU LEAVE: -follow up: yearly and as needed  CALL TODAY to schedule your mammogram.  Vit D3 [phone] IU daily  We have ordered a pap smear at this visit. It can take up to 1-2 weeks for results and processing. IF results require follow up or explanation, we will call you with instructions. Clinically stable results will be released to your [facility]. If you have not heard from [place] or cannot find your results in [facility] in 2 weeks please contact our office at [phone].  If you are not yet signed up for [facility], please consider signing up.   We recommend the following healthy lifestyle for LIFE: 1) Small portions.   Tip: eat off of a salad plate instead of a dinner plate.  Tip: if you need more or a snack choose fruits, veggies and/or a handful of nuts or seeds.  2) Eat a healthy clean diet.  * Tip: Avoid (less then 1 serving per week): processed foods, sweets, sweetened drinks, white starches (rice, flour, bread, potatoes, pasta, etc), red meat, fast foods, butter  *Tip: CHOOSE instead   * 5-9 servings per day of fresh or frozen fruits and vegetables (but not corn, potatoes, bananas, canned or dried fruit)   *nuts and seeds, beans   *olives and olive oil   *small portions of lean meats such as fish and white chicken    *small portions of whole grains  3)Get at least 150 minutes of sweaty aerobic exercise per week.  4)Reduce stress - consider counseling, meditation and relaxation to balance other aspects of your life.

## 2016-09-16 NOTE — Progress Notes (Signed)
HPI:  Here for CPE:  -Concerns and/or follow up today: none She saw a gastroenterologist about her chronic abdominal pain. They mainly provided reassurance and a trial Bentyl. Her ultrasound was normal. She reports her symptoms have resolved.  She just had lab work done in office good including a CMP, lipase and hemoglobin A1c along with a CBC. She already had her cholesterol done earlier this year.  -Diet: variety of foods, balance and well rounded  -Exercise: reports she gets regular exercise  -Taking folic acid, vitamin D or calcium: no  -Hx of HTN: no  -Vaccines: UTD  -pap history: In the past she did her Pap smears and breast exams with her gynecologist, reports last pap in 2014 notmal  -FDLMP: n/a  -sexual activity: yes, female partner, no new partners  -wants STI testing (Hep C if born 48-65): no  -FH breast, colon or ovarian ca: see FH Last mammogram: past due - did with gyn in the past Last colon cancer screening: 2009 per pt in [place] and negative  Breast Ca Risk Assessment: -see family and personal hx, sees gyn for breast health  -Alcohol, Tobacco, drug use: see social history  Review of Systems - no fevers, unintentional weight loss, vision loss, hearing loss, chest pain, sob, hemoptysis, melena, hematochezia, hematuria, genital discharge, changing or concerning skin lesions, bleeding, bruising, loc, thoughts of self harm or SI  Past Medical History:  Diagnosis Date  . GERD (gastroesophageal reflux disease)   . Helicobacter pylori ab+ 2009   treated in [place]     Past Surgical History:  Procedure Laterality Date  . bartholin gland    . COLONOSCOPY  2009   [place] - ok  . ESOPHAGOGASTRODUODENOSCOPY  2009   [place] - ulcer scar  . OVARIAN CYST SURGERY Bilateral     No family history on file.  Social History   Social History  . Marital status: Married    Spouse name: N/A  . Number of children: 2  . Years of education: N/A   Occupational History   . tailor [employer]   Social History Main Topics  . Smoking status: Never Smoker  . Smokeless tobacco: Never Used  . Alcohol use No  . Drug use: No  . Sexual activity: No   Other Topics Concern  . None   Social History Narrative   Married, 1 son (71 yo)one daughter (68) in 2014   Graded from [place]   Works as a tailor    Caffeine   02/20/2013   No regular exercise; diet if fair                     Current Outpatient Prescriptions:  .  dicyclomine (BENTYL) 10 MG capsule, Take 1 capsule (10 mg total) by mouth 4 (four) times daily as needed for spasms. Abdominal pain - may take 2 if 1 not helpful, Disp: 90 capsule, Rfl: 0 .  Esomeprazole Magnesium (NEXIUM PO), Take by mouth., Disp: , Rfl:   EXAM:  Vitals:   09/16/16 1508  BP: 100/72  Pulse: 81  Temp: 98.5 F (36.9 C)    GENERAL: vitals reviewed and listed below, alert, oriented, appears well hydrated and in no acute distress  HEENT: head atraumatic, PERRLA, normal appearance of eyes, ears, nose and mouth. moist mucus membranes.  NECK: supple, no masses or lymphadenopathy  LUNGS: clear to auscultation bilaterally, no rales, rhonchi or wheeze  CV: HRRR, no peripheral edema or cyanosis, normal pedal pulses  BREAST: normal appearance - no lesions or discharge, on palpation normal breast tissue without any suspicious masses  ABDOMEN: bowel sounds normal, soft, non tender to palpation, no masses, no rebound or guarding  GU: normal appearance of external genitalia - no lesions or masses, normal vaginal mucosa - no abnormal discharge, normal appearance of cervix - no lesions or abnormal discharge, no masses or tenderness on palpation of uterus and ovaries.  RECTAL: refused  SKIN: no rash or abnormal lesions  MS: normal gait, moves all extremities normally  NEURO: normal gait, speech and thought processing grossly intact, muscle tone grossly intact throughout  PSYCH: normal affect, pleasant and  cooperative  ASSESSMENT AND PLAN:  Discussed the following assessment and plan:  Encounter for preventive health examination  Encounter for cervical Pap smear with pelvic exam  -she is overdue on pap and mammo, breast exam, she seems to busy to schedule right now so advised she do pap, breast exam here today and schedule mammogram - she agreed  -Discussed and advised all [place] preventive services health task force level A and B recommendations for age, sex and risks.  -Advised at least 150 minutes of exercise per week and a healthy diet with avoidance of (less then 1 serving per week) processed foods, white starches, red meat, fast foods and sweets and consisting of: * 5-9 servings of fresh fruits and vegetables (not corn or potatoes) *nuts and seeds, beans *olives and olive oil *lean meats such as fish and white chicken  *whole grains  -labs, studies and vaccines per orders this encounter  No orders of the defined types were placed in this encounter.   Patient advised to return to clinic immediately if symptoms worsen or persist or new concerns.  Patient Instructions  BEFORE YOU LEAVE: -follow up: yearly and as needed  CALL TODAY to schedule your mammogram.  Vit D3 [phone] IU daily  We have ordered a pap smear at this visit. It can take up to 1-2 weeks for results and processing. IF results require follow up or explanation, we will call you with instructions. Clinically stable results will be released to your [facility]. If you have not heard from [place] or cannot find your results in [facility] in 2 weeks please contact our office at [phone].  If you are not yet signed up for [facility], please consider signing up.   We recommend the following healthy lifestyle for LIFE: 1) Small portions.   Tip: eat off of a salad plate instead of a dinner plate.  Tip: if you need more or a snack choose fruits, veggies and/or a handful of nuts or seeds.  2) Eat a healthy clean diet.  * Tip:  Avoid (less then 1 serving per week): processed foods, sweets, sweetened drinks, white starches (rice, flour, bread, potatoes, pasta, etc), red meat, fast foods, butter  *Tip: CHOOSE instead   * 5-9 servings per day of fresh or frozen fruits and vegetables (but not corn, potatoes, bananas, canned or dried fruit)   *nuts and seeds, beans   *olives and olive oil   *small portions of lean meats such as fish and white chicken    *small portions of whole grains  3)Get at least 150 minutes of sweaty aerobic exercise per week.  4)Reduce stress - consider counseling, meditation and relaxation to balance other aspects of your life.                   No Follow-up on file.  Lucretia Kern.,  DO

## 2016-09-20 LAB — CYTOLOGY - PAP
Diagnosis: NEGATIVE
HPV (WINDOPATH): NOT DETECTED

## 2016-09-21 ENCOUNTER — Telehealth: Payer: Self-pay | Admitting: Family Medicine

## 2016-09-21 NOTE — Telephone Encounter (Signed)
Pts daughter called and wanted to know if you could call her due to the mother not being able to speak [place] and did not understand what you was calling for.  Daughter is on the DPR to speak with her about the mother.  Please call the daughter tomorrow or today after 5:45

## 2016-09-21 NOTE — Telephone Encounter (Signed)
I called the pts daughter and informed her of the results.

## 2016-09-23 ENCOUNTER — Other Ambulatory Visit: Payer: Self-pay | Admitting: Family Medicine

## 2016-09-23 DIAGNOSIS — Z1231 Encounter for screening mammogram for malignant neoplasm of breast: Secondary | ICD-10-CM

## 2016-10-11 ENCOUNTER — Encounter: Payer: Self-pay | Admitting: *Deleted

## 2016-10-11 ENCOUNTER — Telehealth: Payer: Self-pay | Admitting: *Deleted

## 2016-10-11 NOTE — Congregational Nurse Program (Unsigned)
Congregational Nurse Program Note  Date of Encounter: 09/23/2016 Past Medical History: Past Medical History:  Diagnosis Date  . GERD (gastroesophageal reflux disease)   . Helicobacter pylori ab+ 2009   treated in [place]     Encounter Details:     CNP Questionnaire - 09/23/16 1300      Patient Demographics   Is this a new or existing patient? Existing   Patient is considered a/an Immigrant   Race Asian     Patient Assistance   Location of Patient Assistance Not Applicable   Patient's financial/insurance status Affordable Care Act   Uninsured Patient ([place] Card/Care Connects) No   Patient referred to apply for the following financial assistance Not Applicable   Food insecurities addressed Not Applicable   Transportation assistance No   Assistance securing medications No   Educational health offerings Spiritual care;Other  f/u ultra sonogram     Encounter Details   Primary purpose of visit Spiritual Care/Support Visit;Education/Health Concerns   Was an Emergency Department visit averted? Not Applicable   Does patient have a medical provider? No   Patient referred to Not Applicable   Was a mental health screening completed? (GAINS tool) No   Does patient have dental issues? No   Does patient have vision issues? No   Does your patient have an abnormal blood pressure today? No   Since previous encounter, have you referred patient for abnormal blood pressure that resulted in a new diagnosis or medication change? No   Does your patient have an abnormal blood glucose today? No   Since previous encounter, have you referred patient for abnormal blood glucose that resulted in a new diagnosis or medication change? No   Was there a life-saving intervention made? No     visited working place to f/u pain and result ultra sonogram. No further discomfort voiced.said pain is getting better.need mammogram appointment need, made an appointment with breast center on December 27,2017.

## 2016-10-11 NOTE — Congregational Nurse Program (Unsigned)
Congregational Nurse Program Note  Date of Encounter: 09/14/2016 Past Medical History: Past Medical History:  Diagnosis Date  . GERD (gastroesophageal reflux disease)   . Helicobacter pylori ab+ 2009   treated in [place]     Encounter Details:     CNP Questionnaire - 09/14/16 0830      Patient Demographics   Is this a new or existing patient? Existing   Patient is considered a/an Immigrant   Race Asian     Patient Assistance   Location of Patient Assistance Not Applicable  Imaging center   Patient's financial/insurance status Affordable Care Act   Uninsured Patient ([place] Card/Care Connects) No   Patient referred to apply for the following financial assistance Not Applicable   Food insecurities addressed Not Applicable   Transportation assistance Yes   Type of Assistance Volunteer   Assistance securing medications No   Educational health offerings Other  toexam for ultra sonogram     Encounter Details   Primary purpose of visit Other   Was an Emergency Department visit averted? Not Applicable   Does patient have a medical provider? Yes   Patient referred to Not Applicable   Was a mental health screening completed? (GAINS tool) No   Does patient have dental issues? No   Does patient have vision issues? No   Does your patient have an abnormal blood pressure today? No   Since previous encounter, have you referred patient for abnormal blood pressure that resulted in a new diagnosis or medication change? No   Does your patient have an abnormal blood glucose today? No   Since previous encounter, have you referred patient for abnormal blood glucose that resulted in a new diagnosis or medication change? No   Was there a life-saving intervention made? No     pt need assist to exam  Ultra sonogram image center @ [place] [facility]. To f/p problems for frank pain, frequently stomach upset.

## 2016-11-03 ENCOUNTER — Ambulatory Visit
Admission: RE | Admit: 2016-11-03 | Discharge: 2016-11-03 | Disposition: A | Discharge status: Home / Self Care | Payer: BLUE CROSS/BLUE SHIELD | Source: Ambulatory Visit | Attending: Family Medicine | Admitting: Family Medicine

## 2016-11-03 DIAGNOSIS — Z1231 Encounter for screening mammogram for malignant neoplasm of breast: Secondary | ICD-10-CM

## 2016-11-04 ENCOUNTER — Encounter: Payer: Self-pay | Admitting: *Deleted

## 2016-11-04 NOTE — Congregational Nurse Program (Unsigned)
Congregational Nurse Program Note  Date of Encounter:11/03/2016  Past Medical History: Past Medical History:  Diagnosis Date  . GERD (gastroesophageal reflux disease)   . Helicobacter pylori ab+ 2009   treated in [place]     Encounter Details:     CNP Questionnaire - 11/03/16 0830      Patient Demographics   Is this a new or existing patient? Existing   Patient is considered a/an Immigrant   Race Asian     Patient Assistance   Location of Patient Assistance Not Applicable   Patient's financial/insurance status Affordable Care Act   Uninsured Patient ([place] Card/Care Connects) No   Patient referred to apply for the following financial assistance Not Applicable   Food insecurities addressed Not Applicable   Transportation assistance Yes   Type of Assistance Volunteer   Assistance securing medications No   Educational health offerings Navigating the healthcare system  breast center     Encounter Details   Primary purpose of visit [place]   Was a mental health screening completed? (GAINS tool) No   Does patient have dental issues? No   Does patient have vision issues? No   Does your patient have an abnormal blood pressure today? No   Since previous encounter, have you referred patient for abnormal blood pressure that resulted in a new diagnosis or medication change? No   Does your patient have an abnormal blood glucose today? No   Since previous encounter, have you referred patient for abnormal blood glucose that resulted in a new diagnosis or medication change? No   Was there a life-saving intervention made? No     Ride given and translated@ breast center @ [facility]

## 2016-12-06 ENCOUNTER — Telehealth: Payer: Self-pay | Admitting: Family Medicine

## 2016-12-06 ENCOUNTER — Encounter: Payer: Self-pay | Admitting: Family Medicine

## 2016-12-06 ENCOUNTER — Ambulatory Visit (INDEPENDENT_AMBULATORY_CARE_PROVIDER_SITE_OTHER): Payer: BLUE CROSS/BLUE SHIELD | Admitting: Family Medicine

## 2016-12-06 VITALS — BP 102/80 | HR 69 | Temp 98.5°F | Ht 60.75 in | Wt 117.8 lb

## 2016-12-06 DIAGNOSIS — R059 Cough, unspecified: Secondary | ICD-10-CM

## 2016-12-06 DIAGNOSIS — R0982 Postnasal drip: Secondary | ICD-10-CM | POA: Diagnosis not present

## 2016-12-06 DIAGNOSIS — H6981 Other specified disorders of Eustachian tube, right ear: Secondary | ICD-10-CM

## 2016-12-06 DIAGNOSIS — R05 Cough: Secondary | ICD-10-CM

## 2016-12-06 DIAGNOSIS — J329 Chronic sinusitis, unspecified: Secondary | ICD-10-CM

## 2016-12-06 MED ORDER — BENZONATATE 100 MG PO CAPS: 100.0000 mg | ORAL_CAPSULE | Freq: Two times a day (BID) | ORAL | 0 refills | Status: DC | PRN

## 2016-12-06 MED ORDER — FLUTICASONE PROPIONATE 50 MCG/ACT NA SUSP: 2.0000 | Freq: Every day | NASAL | 0 refills | Status: DC

## 2016-12-06 NOTE — Telephone Encounter (Signed)
Patient Name: Hajer Rochelle DOB: 11-27-1965 Initial Comment Caller states his mother is coughing, sharp pain in chest. Nurse Assessment Nurse: Ronnald Ramp, RN, Miranda Date/Time (Eastern Time): 12/06/2016 9:48:33 AM Confirm and document reason for call. If symptomatic, describe symptoms. ---Caller states his mother has c/o cold symptoms. Now has cough and discomfort (described as tickle or itching) in her chest when she coughs. Denies chest pain. Symptoms for 2 weeks. Does the patient have any new or worsening symptoms? ---Yes Will a triage be completed? ---Yes Related visit to physician within the last 2 weeks? ---No Does the PT have any chronic conditions? (i.e. diabetes, asthma, etc.) ---No Is the patient pregnant or possibly pregnant? (Ask all females between the ages of 56-55) ---No Is this a behavioral health or substance abuse call? ---No Guidelines Guideline Title Affirmed Question Affirmed Notes Cough - Acute Non-Productive Earache is present Final Disposition User See Physician within 24 Hours Ronnald Ramp, RN, Miranda  Appt scheduled with Dr. Maudie Mercury today at 1:15pm Referrals REFERRED TO PCP OFFICE Disagree/Comply: Comply

## 2016-12-06 NOTE — Progress Notes (Signed)
Pre visit review using our clinic review tool, if applicable. No additional management support is needed unless otherwise documented below in the visit note. 

## 2016-12-06 NOTE — Progress Notes (Signed)
HPI:  Acute visit for cough and congestion. For a few weeks. Lots of sneezing, PND, cough. Hx allergies. Not worsening. No green or yellow mucus, sinus pain, fevers, SOB, wheezing, malaise, NVD or body aches. Has not tried anything. No GERD symptoms recently.  ROS: See pertinent positives and negatives per HPI.  Past Medical History:  Diagnosis Date  . GERD (gastroesophageal reflux disease)   . Helicobacter pylori ab+ 2009   treated in [place]     Past Surgical History:  Procedure Laterality Date  . bartholin gland    . COLONOSCOPY  2009   [place] - ok  . ESOPHAGOGASTRODUODENOSCOPY  2009   [place] - ulcer scar  . OVARIAN CYST SURGERY Bilateral     No family history on file.  Social History   Social History  . Marital status: Married    Spouse name: N/A  . Number of children: 2  . Years of education: N/A   Occupational History  . tailor [employer]   Social History Main Topics  . Smoking status: Never Smoker  . Smokeless tobacco: Never Used  . Alcohol use No  . Drug use: No  . Sexual activity: No   Other Topics Concern  . None   Social History Narrative   Married, 1 son (53 yo)one daughter (101) in 2014   Graded from [place]   Works as a tailor    Caffeine   02/20/2013   No regular exercise; diet if fair                     Current Outpatient Prescriptions:  .  Esomeprazole Magnesium (NEXIUM PO), Take by mouth., Disp: , Rfl:  .  benzonatate (TESSALON) 100 MG capsule, Take 1 capsule (100 mg total) by mouth 2 (two) times daily as needed for cough., Disp: 20 capsule, Rfl: 0 .  fluticasone (FLONASE) 50 MCG/ACT nasal spray, Place 2 sprays into both nostrils daily., Disp: 16 g, Rfl: 0  EXAM:  Vitals:   12/06/16 1319  BP: 102/80  Pulse: 69  Temp: 98.5 F (36.9 C)    Body mass index is 22.44 kg/m.  GENERAL: vitals reviewed and listed above, alert, oriented, appears well hydrated and in no acute distress  HEENT: atraumatic, conjunttiva clear, no  obvious abnormalities on inspection of external nose and ears, normal appearance of ear canals and TMs except for clear effusion R, clear nasal congestion, pale boggy turbinates, mild post oropharyngeal erythema with PND, no tonsillar edema or exudate, no sinus TTP  NECK: no obvious masses on inspection  LUNGS: clear to auscultation bilaterally, no wheezes, rales or rhonchi, good air movement  CV: HRRR, no peripheral edema  MS: moves all extremities without noticeable abnormality  PSYCH: pleasant and cooperative, no obvious depression or anxiety  ASSESSMENT AND PLAN:  Discussed the following assessment and plan:  Rhinosinusitis  PND (post-nasal drip)  Dysfunction of right eustachian tube  Cough  -we discussed possible serious and likely etiologies, workup and treatment, treatment risks and return precautions - looks like allergies from exam. -after this discussion, Naama opted for flonase, tessalon. -follow up advised in 1-2 weeks if symptoms persist, then would get CXR and further eval -of course, we advised Tonji  to return or notify a doctor immediately if symptoms worsen or new concerns arise.  Patient Instructions  Flonase nasal spray 2 sprays each nostril daily for 1 month. May continue after if you wish but then use 1 spray each nostril daily.  Can  try the tessalon for cough per instructions as needed.  Call if worsening or symptoms persist in 1-2 weeks.    Colin Benton R., DO

## 2016-12-06 NOTE — Telephone Encounter (Signed)
Noted  

## 2016-12-06 NOTE — Patient Instructions (Signed)
Flonase nasal spray 2 sprays each nostril daily for 1 month. May continue after if you wish but then use 1 spray each nostril daily.  Can try the tessalon for cough per instructions as needed.  Call if worsening or symptoms persist in 1-2 weeks.

## 2016-12-08 ENCOUNTER — Encounter: Payer: Self-pay | Admitting: *Deleted

## 2016-12-08 ENCOUNTER — Telehealth: Payer: Self-pay | Admitting: Family Medicine

## 2016-12-08 NOTE — Congregational Nurse Program (Unsigned)
Congregational Nurse Program Note  Date of Encounter: 12/07/2016 Past Medical History: Past Medical History:  Diagnosis Date  . GERD (gastroesophageal reflux disease)   . Helicobacter pylori ab+ 2009   treated in [place]     Encounter Details:     CNP Questionnaire - 12/07/16 0800      Patient Demographics   Is this a new or existing patient? Existing   Patient is considered a/an Immigrant   Race Asian     Patient Assistance   Location of Patient Assistance Not Applicable  visit pt   Patient's financial/insurance status Affordable Care Act   Uninsured Patient ([place] Card/Care Connects) No   Patient referred to apply for the following financial assistance Not Applicable   Food insecurities addressed Not Applicable   Transportation assistance No   Assistance securing medications Yes   Type of Assistance Other  information given  regarding of medicins     Encounter Details   Primary purpose of visit Post PCP Visit   Patient referred to Not Applicable   Was a mental health screening completed? (GAINS tool) No   Was a mental health referral made? No   Does patient have dental issues? No   Does patient have vision issues? No   Does your patient have an abnormal blood pressure today? No   Since previous encounter, have you referred patient for abnormal blood pressure that resulted in a new diagnosis or medication change? No   Does your patient have an abnormal blood glucose today? No   Since previous encounter, have you referred patient for abnormal blood glucose that resulted in a new diagnosis or medication change? No    visited pt. Said got medicine for cough.  Forgot to use frequency of nasal spray.  Taught how to use nasal spray. And information given for tessalon pears for cough.

## 2016-12-08 NOTE — Telephone Encounter (Signed)
[facility] - Client [facility] Patient Name: Courtney Figueroa DOB: September 17, 1966 Initial Comment Caller states mother was seen 2 days ago for coughing issue. Rx cough med and nasal spray. Now cough is a little better, but she has thick mucus in chest that is forcing her to cough again. Nurse Assessment Nurse: Zorita Pang, RN, Deborah Date/Time (Eastern Time): 12/08/2016 [id number] PM Confirm and document reason for call. If symptomatic, describe symptoms. ---The caller states that his mother was seen two days ago and she is better but she is still coughing and that causes pain. She is uncomfortable. She states that her throat is irritated. She is running a fever. She does not have a runny nose. Her son states that her doctor gave her a cough syrup and a nasal spray. Does the patient have any new or worsening symptoms? ---Yes Will a triage be completed? ---Yes Related visit to physician within the last 2 weeks? ---Yes Does the PT have any chronic conditions? (i.e. diabetes, asthma, etc.) ---No Is the patient pregnant or possibly pregnant? (Ask all females between the ages of 59-55) ---No Is this a behavioral health or substance abuse call? ---No Guidelines Guideline Title Affirmed Question Affirmed Notes Cough - Acute Non-Productive Cough with cold symptoms (e.g., runny nose, postnasal drip, throat clearing) (all triage questions negative) Final Disposition User [facility] [place], RN, Neoma Laming Comments The caller states that his mother has had a cough and runny nose and saw one of the doctors on Monday. He states that she wants something that will help her get the mucus up out of her throat. He states that she coughs and that makes her chest hurt. The caller states that she has had no difficulty breathing and no fever. The disposition of home care was reviewed with the caller and he verbalizes understanding as well as the need to  call back if she has a cough longer than 10 days or develops a fever. The caller was advised that he can check with his mother's pharmacist where the prescriptions were filled to see if he/she can recommend anything else and he verbalizes understanding. Disagree/Comply: Comply

## 2016-12-08 NOTE — Congregational Nurse Program (Unsigned)
Congregational Nurse Program Note  Date of Encounter: 12/06/2016   Past Medical History: Past Medical History:  Diagnosis Date  . GERD (gastroesophageal reflux disease)   . Helicobacter pylori ab+ 2009   treated in [place]     Encounter Details:     CNP Questionnaire - 12/06/16 0750      Patient Demographics   Is this a new or existing patient? Existing   Patient is considered a/an Immigrant   Race Asian     Patient Assistance   Location of Patient Assistance Not Applicable  visit    Patient's financial/insurance status Affordable Care Act   Uninsured Patient ([place] Card/Care Connects) No   Patient referred to apply for the following financial assistance Not Applicable   Food insecurities addressed Not Applicable   Transportation assistance No   Assistance securing medications No   Educational health offerings Medications     Encounter Details   Primary purpose of visit Education/Health Concerns;Navigating the Healthcare System  dry cough,     Does patient have a medical provider? Yes   Patient referred to Clinic   Was a mental health screening completed? (GAINS tool) No   Does patient have dental issues? No   Does patient have vision issues? No   Does your patient have an abnormal blood pressure today? No   Since previous encounter, have you referred patient for abnormal blood pressure that resulted in a new diagnosis or medication change? No   Does your patient have an abnormal blood glucose today? No   Since previous encounter, have you referred patient for abnormal blood glucose that resulted in a new diagnosis or medication change? No   Was there a life-saving intervention made? No     c/o cont. Dry cough. Denies any other Sx. Told pt to visit Dr. And request meds.

## 2016-12-09 NOTE — Telephone Encounter (Signed)
Pt's son has been advised of OTC medication to help with pt's symptoms. Would like to know if there is anything more you would recommend?  Dr. Maudie Mercury - Please advise. Thanks!

## 2016-12-10 ENCOUNTER — Ambulatory Visit (INDEPENDENT_AMBULATORY_CARE_PROVIDER_SITE_OTHER): Payer: BLUE CROSS/BLUE SHIELD | Admitting: Family Medicine

## 2016-12-10 ENCOUNTER — Encounter: Payer: Self-pay | Admitting: Family Medicine

## 2016-12-10 VITALS — BP 98/70 | HR 81 | Temp 98.4°F | Ht 60.75 in | Wt 118.9 lb

## 2016-12-10 DIAGNOSIS — R05 Cough: Secondary | ICD-10-CM | POA: Diagnosis not present

## 2016-12-10 DIAGNOSIS — R0989 Other specified symptoms and signs involving the circulatory and respiratory systems: Secondary | ICD-10-CM | POA: Diagnosis not present

## 2016-12-10 DIAGNOSIS — R059 Cough, unspecified: Secondary | ICD-10-CM

## 2016-12-10 MED ORDER — AMOXICILLIN-POT CLAVULANATE 875-125 MG PO TABS: 1.0000 | ORAL_TABLET | Freq: Two times a day (BID) | ORAL | 0 refills | Status: DC

## 2016-12-10 NOTE — Progress Notes (Signed)
HPI:  Follow up cough: -started several weeks ago -started flonase and tessalon and was improving but then felt developed worsening and thicker cough, chest congestion and burning sensation in chest -started amoxicillin she had from [facility] yesterday and this seemed to help some -she has mild tinge of blood when blew her nose today and thicker nasal congestion -no fevers, SOB, wheezing, ear pain, NVD, sinus pain or acid reflux -sounds like she takes her nexium sometimes still   ROS: See pertinent positives and negatives per HPI.  Past Medical History:  Diagnosis Date  . GERD (gastroesophageal reflux disease)   . Helicobacter pylori ab+ 2009   treated in [place]     Past Surgical History:  Procedure Laterality Date  . bartholin gland    . COLONOSCOPY  2009   [place] - ok  . ESOPHAGOGASTRODUODENOSCOPY  2009   [place] - ulcer scar  . OVARIAN CYST SURGERY Bilateral     No family history on file.  Social History   Social History  . Marital status: Married    Spouse name: N/A  . Number of children: 2  . Years of education: N/A   Occupational History  . tailor [employer]   Social History Main Topics  . Smoking status: Never Smoker  . Smokeless tobacco: Never Used  . Alcohol use No  . Drug use: No  . Sexual activity: No   Other Topics Concern  . None   Social History Narrative   Married, 1 son (51 yo)one daughter (2) in 2014   Graded from [place]   Works as a tailor    Caffeine   02/20/2013   No regular exercise; diet if fair                     Current Outpatient Prescriptions:  .  AMOXICILLIN PO, Take by mouth., Disp: , Rfl:  .  benzonatate (TESSALON) 100 MG capsule, Take 1 capsule (100 mg total) by mouth 2 (two) times daily as needed for cough., Disp: 20 capsule, Rfl: 0 .  Esomeprazole Magnesium (NEXIUM PO), Take by mouth., Disp: , Rfl:  .  fluticasone (FLONASE) 50 MCG/ACT nasal spray, Place 2 sprays into both nostrils daily., Disp: 16 g, Rfl: 0 .   amoxicillin-clavulanate (AUGMENTIN) 875-125 MG tablet, Take 1 tablet by mouth 2 (two) times daily., Disp: 14 tablet, Rfl: 0  EXAM:  Vitals:   12/10/16 1617  BP: 98/70  Pulse: 81  Temp: 98.4 F (36.9 C)    Body mass index is 22.65 kg/m.  GENERAL: vitals reviewed and listed above, alert, oriented, appears well hydrated and in no acute distress  HEENT: atraumatic, conjunttiva clear, no obvious abnormalities on inspection of external nose and ears, normal appearance of ear canals and TMs, clear nasal congestion, mild post oropharyngeal erythema with PND, no tonsillar edema or exudate, no sinus TTP  NECK: no obvious masses on inspection  LUNGS: clear to auscultation bilaterally, no wheezes, rales or rhonchi, good air movement  CV: HRRR, no peripheral edema  MS: moves all extremities without noticeable abnormality  PSYCH: pleasant and cooperative, no obvious depression or anxiety  ASSESSMENT AND PLAN:  Discussed the following assessment and plan:  Cough - Plan: DG Chest 2 View  Chest congestion  -discussed causes of cough - query allergies, reflux, possible sinusitis or resp infection given the thicker mucus and that she thinks is improving with amoxicillin -will get CXR, change to augmentin and take nexium every day -advised follow  up in 5 days if symptoms persist or sooner if worsening -advised holding of flonase for a few days given the mild nose bleed -of course, we advised to return or notify a doctor immediately if symptoms worsen or persist or new concerns arise.    Patient Instructions  BEFORE YOU LEAVE: -xray sheet -follow up:  Follow up in 5 days if symptoms persist  Get the chest xray.  Stop the flonase for 3 days.  Stop the amoxicillin and take the Augmentin.  Call if symptoms persist in 5 days, sooner if worsening.  Make sure to take the nexium every day.      Colin Benton R., DO

## 2016-12-10 NOTE — Telephone Encounter (Signed)
No answer at the pts home number and I left a message for the pts son Ki to return my call.

## 2016-12-10 NOTE — Patient Instructions (Addendum)
BEFORE YOU LEAVE: -xray sheet -follow up:  Follow up in 5 days if symptoms persist  Get the chest xray.  Stop the flonase for 3 days.  Stop the amoxicillin and take the Augmentin.  Call if symptoms persist in 5 days, sooner if worsening.  Make sure to take the nexium every day.

## 2016-12-10 NOTE — Progress Notes (Signed)
Pre visit review using our clinic review tool, if applicable. No additional management support is needed unless otherwise documented below in the visit note. 

## 2016-12-10 NOTE — Telephone Encounter (Signed)
Can she come in for recheck today? Would like to listen to lungs again and she may need abx...thanks.

## 2016-12-10 NOTE — Telephone Encounter (Signed)
I called the pt and spoke with her son and he stated the pt is feeling better today and is taking a [place] antibiotic that was given to her by a friend.  Appt scheduled for today at 4:15pm and message sent to Dr Maudie Mercury as Juluis Rainier.

## 2016-12-13 ENCOUNTER — Ambulatory Visit (INDEPENDENT_AMBULATORY_CARE_PROVIDER_SITE_OTHER)
Admission: RE | Admit: 2016-12-13 | Discharge: 2016-12-13 | Disposition: A | Discharge status: Home / Self Care | Payer: BLUE CROSS/BLUE SHIELD | Source: Ambulatory Visit | Attending: Family Medicine | Admitting: Family Medicine

## 2016-12-13 DIAGNOSIS — R05 Cough: Secondary | ICD-10-CM | POA: Diagnosis not present

## 2016-12-13 DIAGNOSIS — R059 Cough, unspecified: Secondary | ICD-10-CM

## 2016-12-13 NOTE — Telephone Encounter (Signed)
Pt going for chest xray today and wants Dr Maudie Mercury to be advised pt was dx in 2004 with TB.  Pt has one yr of meds and in 2005 dr cleared her. Pt just wanted Dr Maudie Mercury in case anything showed up pertaining to that.

## 2016-12-15 ENCOUNTER — Encounter: Payer: Self-pay | Admitting: *Deleted

## 2016-12-15 NOTE — Congregational Nurse Program (Signed)
Congregational Nurse Program Note  Date of Encounter: 12/12/2016 Past Medical History: Past Medical History:  Diagnosis Date  . GERD (gastroesophageal reflux disease)   . Helicobacter pylori ab+ 2009   treated in [place]     Encounter Details:     CNP Questionnaire - 12/12/16 1300      Patient Demographics   Is this a new or existing patient? Existing   Patient is considered a/an Immigrant   Race Asian     Patient Assistance   Location of Patient Assistance Not Applicable   Patient's financial/insurance status Affordable Care Act   Uninsured Patient ([place] Card/Care Connects) No   Patient referred to apply for the following financial assistance Not Applicable   Food insecurities addressed Not Applicable   Transportation assistance No   Assistance securing medications Yes   Type of Assistance Other   Educational health offerings Medications     Encounter Details   Primary purpose of visit Chronic Illness/Condition Visit;Education/Health Concerns   Was an Emergency Department visit averted? Not Applicable   Does patient have a medical provider? Yes   Patient referred to Not Applicable   Was a mental health screening completed? (GAINS tool) No   Was a mental health referral made? No   Does patient have dental issues? No   Does patient have vision issues? No   Does your patient have an abnormal blood pressure today? No   Since previous encounter, have you referred patient for abnormal blood pressure that resulted in a new diagnosis or medication change? No   Does your patient have an abnormal blood glucose today? No   Since previous encounter, have you referred patient for abnormal blood glucose that resulted in a new diagnosis or medication change? No   Was there a life-saving intervention made? No    remains cough, Asked if GERD caused cough? She was on over the counter drug nexium but half doseof med. that used to Rx. Pt said will vs MD for rx. Will F/U

## 2016-12-24 ENCOUNTER — Telehealth: Payer: Self-pay | Admitting: *Deleted

## 2016-12-24 ENCOUNTER — Ambulatory Visit (INDEPENDENT_AMBULATORY_CARE_PROVIDER_SITE_OTHER): Payer: BLUE CROSS/BLUE SHIELD | Admitting: Family Medicine

## 2016-12-24 ENCOUNTER — Encounter: Payer: Self-pay | Admitting: Family Medicine

## 2016-12-24 VITALS — BP 102/80 | HR 81 | Temp 98.3°F | Ht 60.75 in | Wt 119.1 lb

## 2016-12-24 DIAGNOSIS — N390 Urinary tract infection, site not specified: Secondary | ICD-10-CM

## 2016-12-24 DIAGNOSIS — R319 Hematuria, unspecified: Secondary | ICD-10-CM | POA: Diagnosis not present

## 2016-12-24 DIAGNOSIS — N3001 Acute cystitis with hematuria: Secondary | ICD-10-CM

## 2016-12-24 LAB — POCT URINALYSIS DIPSTICK
BILIRUBIN UA: NEGATIVE
Glucose, UA: NEGATIVE
KETONES UA: NEGATIVE
Nitrite, UA: NEGATIVE
PH UA: 7
PROTEIN UA: NEGATIVE
Urobilinogen, UA: 0.2

## 2016-12-24 MED ORDER — NITROFURANTOIN MONOHYD MACRO 100 MG PO CAPS: 100.0000 mg | ORAL_CAPSULE | Freq: Two times a day (BID) | ORAL | 0 refills | Status: DC

## 2016-12-24 NOTE — Patient Instructions (Signed)
BEFORE YOU LEAVE: -urine culture  Take the Macrobid as instructed.  Follow up if any worsening, new or persistent concerns.  I hope you fell better soon!

## 2016-12-24 NOTE — Progress Notes (Signed)
Pre visit review using our clinic review tool, if applicable. No additional management support is needed unless otherwise documented below in the visit note. 

## 2016-12-24 NOTE — Progress Notes (Signed)
  HPI:  Acute visit for Dysuria: -started yesterday -frequency, urgency, sm amount blood on TP when wiped -no fevers, malaise, flank pain, NVD, vaginal symptoms  ROS: See pertinent positives and negatives per HPI.  Past Medical History:  Diagnosis Date  . GERD (gastroesophageal reflux disease)   . Helicobacter pylori ab+ 2009   treated in [place]     Past Surgical History:  Procedure Laterality Date  . bartholin gland    . COLONOSCOPY  2009   [place] - ok  . ESOPHAGOGASTRODUODENOSCOPY  2009   [place] - ulcer scar  . OVARIAN CYST SURGERY Bilateral     No family history on file.  Social History   Social History  . Marital status: Married    Spouse name: N/A  . Number of children: 2  . Years of education: N/A   Occupational History  . tailor [employer]   Social History Main Topics  . Smoking status: Never Smoker  . Smokeless tobacco: Never Used  . Alcohol use No  . Drug use: No  . Sexual activity: No   Other Topics Concern  . 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  Esomeprazole Magnesium (NEXIUM PO), Take by mouth., Disp: , Rfl:  .  nitrofurantoin, macrocrystal-monohydrate, (MACROBID) 100 MG capsule, Take 1 capsule (100 mg total) by mouth 2 (two) times daily., Disp: 14 capsule, Rfl: 0  EXAM:  Vitals:   12/24/16 1112  BP: 102/80  Pulse: 81  Temp: 98.3 F (36.8 C)    Body mass index is 22.69 kg/m.  GENERAL: vitals reviewed and listed above, alert, oriented, appears well hydrated and in no acute distress  HEENT: atraumatic, conjunttiva clear, no obvious abnormalities on inspection of external nose and ears  NECK: no obvious masses on inspection  LUNGS: clear to auscultation bilaterally, no wheezes, rales or rhonchi, good air  movement  CV: HRRR, no peripheral edema  ABD: BS+, soft, NTTP, no CVA TTP  MS: moves all extremities without noticeable abnormality  PSYCH: pleasant and cooperative, no obvious depression or anxiety  ASSESSMENT AND PLAN:  Discussed the following assessment and plan:  Urinary tract infection with hematuria, site unspecified - Plan: POCT urinalysis dipstick, Culture, Urine  Acute cystitis with hematuria  -tx with abx -can get worse reflux with abx, she will use double dose reflux med if needed and follow up if needed -Patient advised to return or notify a doctor immediately if symptoms worsen or persist or new concerns arise.  Patient Instructions  BEFORE YOU LEAVE: -urine culture  Take the Macrobid as instructed.  Follow up if any worsening, new or persistent concerns.  I hope you fell better soon!    Colin Benton R., DO

## 2016-12-26 LAB — URINE CULTURE: Organism ID, Bacteria: NO GROWTH

## 2016-12-28 NOTE — Addendum Note (Signed)
Addended by: Agnes Lawrence on: 12/28/2016 06:06 PM   Modules accepted: Orders

## 2017-01-08 ENCOUNTER — Encounter: Payer: Self-pay | Admitting: *Deleted

## 2017-01-08 NOTE — Congregational Nurse Program (Unsigned)
Congregational Nurse Program Note  Date of Encounter: 12/31/2016 Past Medical History: Past Medical History:  Diagnosis Date  . GERD (gastroesophageal reflux disease)   . Helicobacter pylori ab+ 2009   treated in [place]     Encounter Details:     CNP Questionnaire - 12/31/16 0800      Patient Demographics   Is this a new or existing patient? Existing   Patient is considered a/an Immigrant   Race Asian     Patient Assistance   Location of Patient Assistance Not Applicable  visit her office   Patient's financial/insurance status Affordable Care Act   Uninsured Patient ([place] Card/Care Connects) No   Patient referred to apply for the following financial assistance Not Applicable   Food insecurities addressed Not Applicable   Transportation assistance No   Assistance securing medications No   Educational health offerings Exercise/physical activity;Spiritual care  kegel exercise     Encounter Details   Primary purpose of visit Spiritual Care/Support Visit;Other  f/u after UTI Sx   Was an Emergency Department visit averted? Not Applicable   Does patient have a medical provider? Yes   Patient referred to Not Applicable   Was a mental health screening completed? (GAINS tool) No   Does patient have dental issues? No   Does patient have vision issues? No   Does your patient have an abnormal blood pressure today? No   Since previous encounter, have you referred patient for abnormal blood pressure that resulted in a new diagnosis or medication change? No   Does your patient have an abnormal blood glucose today? No   Since previous encounter, have you referred patient for abnormal blood glucose that resulted in a new diagnosis or medication change? No    visit her after UTI treated by antibiotic by MD, said urinary incontinency when cough. Taught Kegel exercise and empty bladder regulary. Pt said she need visit Dr. For follow up urine test. She will let me know. Will f/u.

## 2017-05-13 ENCOUNTER — Telehealth: Payer: Self-pay

## 2017-05-13 NOTE — Telephone Encounter (Signed)
Patient's daughter, Santiago Glad, called to report that pt has had several bouts of diarrhea over the past 2 days. She may have eaten some spicy food that might have upset her stomach, as she has occasional stomach issues. Daughter states that they have not tried any OTC medications/treatments. She also denies any fever or abdominal pain. Advised her to try Imodium, Metamucil for increased stool consistency and increase fluid intake. Call office if this does not help or if pt develops f/n/v or abdominal pain. Advised daughter that we do have Saturday clinic option if she is not improving. She voiced understanding. Nothing further needed at this time.

## 2017-06-20 ENCOUNTER — Encounter: Payer: Self-pay | Admitting: Family Medicine

## 2017-06-20 ENCOUNTER — Ambulatory Visit (INDEPENDENT_AMBULATORY_CARE_PROVIDER_SITE_OTHER): Payer: BLUE CROSS/BLUE SHIELD | Admitting: Family Medicine

## 2017-06-20 VITALS — BP 118/80 | HR 66 | Temp 98.5°F | Ht 60.75 in | Wt 119.3 lb

## 2017-06-20 DIAGNOSIS — M791 Myalgia, unspecified site: Secondary | ICD-10-CM

## 2017-06-20 DIAGNOSIS — H02846 Edema of left eye, unspecified eyelid: Secondary | ICD-10-CM

## 2017-06-20 DIAGNOSIS — K219 Gastro-esophageal reflux disease without esophagitis: Secondary | ICD-10-CM

## 2017-06-20 DIAGNOSIS — R293 Abnormal posture: Secondary | ICD-10-CM | POA: Diagnosis not present

## 2017-06-20 NOTE — Patient Instructions (Signed)
BEFORE YOU LEAVE: -neck spasm exercises -follow up: CPE in 3 months  Call for exam with Opthomologist regarding your eyelid concerns. Let us know if symptoms persist or worsen.  Do the postural and home exercises for the neck and let us know if symptoms persist.

## 2017-06-20 NOTE — Progress Notes (Signed)
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 Carlean Purl -takes PPI -denies gerd, melena, hematochezia -reported negative colonoscopy in 2009 and due for repeat next year per chart and pt  ROS: See pertinent positives and negatives per HPI.  Past Medical History:  Diagnosis Date  . GERD (gastroesophageal reflux disease)   . Helicobacter pylori ab+ 2009   treated in [place]     Past Surgical History:  Procedure Laterality Date  . bartholin gland    . COLONOSCOPY  2009   [place] - ok  . ESOPHAGOGASTRODUODENOSCOPY  2009   [place] - ulcer scar  . OVARIAN CYST SURGERY Bilateral     No family history on file.  Social History   Social History  . Marital status: Married    Spouse name: N/A  . Number of children: 2  . Years of education: N/A   Occupational History  . tailor [employer]   Social History Main Topics  . Smoking status: Never Smoker  . Smokeless tobacco: Never Used  . Alcohol use No  . Drug use: No  . Sexual activity: No   Other Topics Concern  . None   Social History Narrative   Married, 1 son (10 yo)one daughter (44) in 2014   Graded from [place]   Works as a tailor    Caffeine   02/20/2013   No regular exercise; diet if fair                     Current Outpatient Prescriptions:  .  Esomeprazole Magnesium (NEXIUM PO), Take by mouth., Disp: , Rfl:   EXAM:  Vitals:   06/20/17 0836  BP:  118/80  Pulse: 66  Temp: 98.5 F (36.9 C)    Body mass index is 22.73 kg/m.  GENERAL: vitals reviewed and listed above, alert, oriented, appears well hydrated and in no acute distress  HEENT: atraumatic, conjunttiva clear, visual acuity grossly intact, EOMI, no appreciable abnormalities of the eyes/lids/pupils on gross exam, no obvious abnormalities on inspection of external nose and ears  NECK: no obvious masses on inspection, no bruit, no bony TTP  LUNGS: clear to auscultation bilaterally, no wheezes, rales or rhonchi, good air movement  CV: HRRR, no peripheral edema  MS/NEURO: moves all extremities without noticeable abnormality, head forward poster, muscle tension bilat trapezius muscles and sub occ muscle, normal ROM head and neck, NV intact distal, CN II-XII grossly intact, finger to nose normal  PSYCH: pleasant and cooperative, no obvious depression or anxiety  ASSESSMENT AND PLAN:  Discussed the following assessment and plan:  Swelling of eyelid, left -we discussed possible serious and likely etiologies, workup and treatment, treatment risks and return precautions -normal gross exam of eye and neuro exam today -after this discussion, Courtney Figueroa opted for optho eval to start with, advised her to let us know if unrevealing and persistent, worsening or changing symptoms   Muscle soreness -HEP, postural instructions for proper posture at work, heat,  top treatments -advised follow up if persistent or worsening symptoms  Poor posture See above  Gastroesophageal reflux disease without esophagitis -No symptoms at this time and it seems she is up-to-date on screening, advised discussion with her gastroenterologist if any symptoms if she wishes to do early screening - however, explained that our garments and the uterus to defer from those in [place] insurance being not pay for screening.  -Patient advised to return or notify a doctor immediately if symptoms worsen or persist or new  concerns arise.  Patient Instructions  BEFORE YOU LEAVE: -neck spasm exercises -follow up: CPE in 3 months  Call for exam with Opthomologist regarding your eyelid concerns. Let us know if symptoms persist or worsen.  Do the postural and home exercises for the neck and let us know if symptoms persist.    Colin Benton R., DO

## 2017-06-22 ENCOUNTER — Telehealth: Payer: Self-pay | Admitting: Family Medicine

## 2017-06-22 DIAGNOSIS — R202 Paresthesia of skin: Secondary | ICD-10-CM

## 2017-06-22 NOTE — Telephone Encounter (Signed)
pts daughter state that her mother is having some tingling on the L side of the face, neck and shoulder. Pts daughter would like to see if they could get an referral to a neurologist.  Pts daughter would like to know if the pts should keep the appointment for the ophthalmologist.

## 2017-06-23 ENCOUNTER — Encounter: Payer: Self-pay | Admitting: Neurology

## 2017-06-23 NOTE — Telephone Encounter (Signed)
I called the pts daughter and left a detailed message the referral was entered and someone will call with appt info.

## 2017-06-23 NOTE — Telephone Encounter (Signed)
Ok to refer. Advise appt here if new issue or concerns.

## 2017-07-02 ENCOUNTER — Ambulatory Visit (HOSPITAL_COMMUNITY)
Admission: EM | Admit: 2017-07-02 | Discharge: 2017-07-02 | Disposition: A | Discharge status: Home / Self Care | Payer: BLUE CROSS/BLUE SHIELD | Attending: Family Medicine | Admitting: Family Medicine

## 2017-07-02 ENCOUNTER — Encounter (HOSPITAL_COMMUNITY): Payer: Self-pay | Admitting: Family Medicine

## 2017-07-02 DIAGNOSIS — S6010XA Contusion of unspecified finger with damage to nail, initial encounter: Secondary | ICD-10-CM

## 2017-07-02 NOTE — ED Provider Notes (Signed)
[facility]    CSN: [id number] Arrival date & time: 07/02/17  1607     History   Chief Complaint Chief Complaint  Patient presents with  . Finger Injury    HPI Courtney Figueroa is a 51 y.o. female.   51 year old female with history of GERD comes in with her family member for 1 day history of right thumb pain after jamming her finger on her car door. Patient speaks [place], and her family members translated for patient. She states she was parked on the hill, and door closed over her finger on its own. She has had swelling and throbbing pain from the finger, with bleeding under the nail bed. She has used ice compress and ibuprofen with some relief. Denies numbness, tingling. She is able to move her finger, though with pain.       Past Medical History:  Diagnosis Date  . GERD (gastroesophageal reflux disease)   . Helicobacter pylori ab+ 2009   treated in [place]     Patient Active Problem List   Diagnosis Date Noted  . GERD (gastroesophageal reflux disease) 06/29/2013    Past Surgical History:  Procedure Laterality Date  . bartholin gland    . COLONOSCOPY  2009   [place] - ok  . ESOPHAGOGASTRODUODENOSCOPY  2009   [place] - ulcer scar  . OVARIAN CYST SURGERY Bilateral     OB History    Gravida Para Term Preterm AB Living   2 2 2     2    SAB TAB Ectopic Multiple Live Births                   Home Medications    Prior to Admission medications   Medication Sig Start Date End Date Taking? Authorizing Provider  Esomeprazole Magnesium (NEXIUM PO) Take by mouth.    [provider]    Family History History reviewed. No pertinent family history.  Social History Social History  Substance Use Topics  . Smoking status: Never Smoker  . Smokeless tobacco: Never Used  . Alcohol use No     Allergies   Patient has no known allergies.   Review of Systems Review of Systems  Reason unable to perform ROS: See HPI as above.     Physical Exam Triage  Vital Signs ED Triage Vitals  Enc Vitals Group     BP 07/02/17 1631 (!) 149/78     Pulse Rate 07/02/17 1631 70     Resp 07/02/17 1631 18     Temp --      Temp src --      SpO2 07/02/17 1631 98 %     Weight --      Height --      Head Circumference --      Peak Flow --      Pain Score 07/02/17 1632 5     Pain Loc --      Pain Edu? --      Excl. in [place]? --    No data found.   Updated Vital Signs BP (!) 149/78   Pulse 70   Resp 18   LMP 02/09/2013   SpO2 98%      Physical Exam  Constitutional: She appears well-developed and well-nourished. No distress.  Eyes: Pupils are equal, round, and reactive to light. Conjunctivae and EOM are normal.  Musculoskeletal:  Right thumb subungual hematoma. No obvious swelling, erythema, increased warmth. Tenderness on palpation of the DIP. Full ROM, though with  pain. Cap refill < 2 s  Skin: Skin is warm and dry.     UC Treatments / Results  Labs (all labs ordered are listed, but only abnormal results are displayed) Labs Reviewed - No data to display  EKG  EKG Interpretation None       Radiology No results found.  Procedures .Marland KitchenIncision and Drainage Date/Time: 07/02/2017 5:26 PM Performed by: Cathlean Sauer V Authorized by: Vanessa Kick   Consent:    Consent obtained:  Verbal   Consent given by:  Patient   Risks discussed:  Bleeding, incomplete drainage, pain and infection   Alternatives discussed:  No treatment Location:    Type:  Subungual hematoma   Size:  0.5cm   Location:  Upper extremity   Upper extremity location:  Finger   Finger location:  R thumb Pre-procedure details:    Skin preparation:  Betadine Anesthesia (see MAR for exact dosages):    Anesthesia method:  None Post-procedure details:    Patient tolerance of procedure:  Tolerated well, no immediate complications Comments:     Low temp cautery tip for nail trephination. Patient tolerated procedure well and with good relief of throbbing pain.    (including  critical care time)  Medications Ordered in UC Medications - No data to display   Initial Impression / Assessment and Plan / UC Course  I have reviewed the triage vital signs and the nursing notes.  Pertinent labs & imaging results that were available during my care of the patient were reviewed by me and considered in my medical decision making (see chart for details).    Patient with full ROM of thumb, though with pain. Offered xray to rule out fracture, though with low suspicion, patient declined xray. Patient would like to proceed with nail trephination due to throbbing pain. Patient tolerated procedure well. Wound dressed. Patient can take Tylenol/ibuprofen for pain. Continue ice compress for swelling. Return precautions given.  Final Clinical Impressions(s) / UC Diagnoses   Final diagnoses:  Subungual hematoma of digit of hand, initial encounter    New Prescriptions Discharge Medication List as of 07/02/2017  5:17 PM         Ok Edwards, PA-C 07/02/17 1732

## 2017-07-02 NOTE — ED Triage Notes (Signed)
Pt here for injury to right thumb after jamming it last night.

## 2017-07-02 NOTE — Discharge Instructions (Signed)
Continue to dress finger until bleeding stops. Ice compress to help with swelling. Take ibuprofen 400-600mg  three times a day for 5-10 days, take with food to avoid stomach upset. Monitor for worsening of symptoms, increased swelling, increased pain, numbness/tingling, changes in finger color, follow up with PCP or here for reevaluation.

## 2017-07-06 ENCOUNTER — Ambulatory Visit (INDEPENDENT_AMBULATORY_CARE_PROVIDER_SITE_OTHER): Payer: BLUE CROSS/BLUE SHIELD | Admitting: Neurology

## 2017-07-06 ENCOUNTER — Encounter: Payer: Self-pay | Admitting: Neurology

## 2017-07-06 VITALS — BP 128/84 | HR 76 | Ht 62.0 in | Wt 119.0 lb

## 2017-07-06 DIAGNOSIS — R200 Anesthesia of skin: Secondary | ICD-10-CM | POA: Diagnosis not present

## 2017-07-06 DIAGNOSIS — M542 Cervicalgia: Secondary | ICD-10-CM | POA: Diagnosis not present

## 2017-07-06 MED ORDER — GABAPENTIN 100 MG PO CAPS: ORAL_CAPSULE | ORAL | 6 refills | Status: DC

## 2017-07-06 NOTE — Progress Notes (Signed)
NEUROLOGY CONSULTATION NOTE  Courtney Figueroa MRN: [id number] DOB: [birth year]  Referring provider: Dr. Colin Benton Primary care provider: Dr. Colin Benton  Reason for consult:  Facial tingling  Dear Dr Courtney Figueroa:  Thank you for your kind referral of Courtney Figueroa for consultation of the above symptoms. Although her history is well known to you, please allow me to reiterate it for the purpose of our medical record. The patient was accompanied to the clinic by her daughter who also provides collateral information. Patient and daughter declined medical interpreter services, daughter helped with translation. Records and images were personally reviewed where available.  HISTORY OF PRESENT ILLNESS: This is a 51 year old right-handed woman with a history of GERD, presenting for evaluation of left-sided symptoms. 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 She had an uncomfortable feeling inside her left ear. She reports biting the left side of her mouth, then a few days later starting to have the symptoms. She felt swollen around her left eye, like her eyelid was heavy. She saw the eye doctor this morning with apparent normal exam. The numbness on the left side comes and goes, when she stretches her head and neck, this seems to help after she repeats it a few times. Stress seems to make the symptoms worse. When her daughter massages her, she feels a sensation going up her neck when her daughter presses on a certain spot, but that she also feels numb when her daughter works around her left shoulder. She had a brief episode of pain on the left side of her neck around January/February when she turned her head to the left driving. She put icy hot patches and it got better. She reports a similar episode 5 years  ago in [place], she saw a doctor and had a normal head CT. The left leg, right arm/leg are unaffected. She has occasional stabbing pain on the lateral side of her right foot. She denies any history of headaches except when stressed or sleeping wrong. No diplopia, dysarthria/dysphagia, dizziness, bowel/bladder dysfunction. She denies any chest pain, shortness of breath, or palpitations.   PAST MEDICAL HISTORY: Past Medical History:  Diagnosis Date  . GERD (gastroesophageal reflux disease)   . Helicobacter pylori ab+ 2009   treated in [place]: Past Surgical History:  Procedure Laterality Date  . bartholin gland    . COLONOSCOPY  2009   [place] - ok  . ESOPHAGOGASTRODUODENOSCOPY  2009   [place] - ulcer scar  . OVARIAN CYST SURGERY Bilateral     MEDICATIONS: Current Outpatient Prescriptions on File Prior to Visit  Medication Sig Dispense Refill  . Esomeprazole Magnesium (NEXIUM PO) Take by mouth.     No current facility-administered medications on file prior to visit.     ALLERGIES: No Known Allergies  FAMILY HISTORY: No family history on file.  SOCIAL HISTORY: Social History   Social History  . Marital status: Married    Spouse name: N/A  . Number of children: 2  . Years of education: N/A   Occupational History  . tailor [employer]   Social History Main Topics  . Smoking status: Never Smoker  . Smokeless tobacco: Never Used  . Alcohol use No  .  Drug use: No  . Sexual activity: No   Other Topics Concern  . Not on file   Social History Narrative   Married, 1 son (75 yo)one daughter (8) in 2014   Graded from [place]   Works as a tailor    Caffeine   02/20/2013   No regular exercise; diet if fair                    REVIEW OF SYSTEMS: Constitutional: No fevers, chills, or sweats, no generalized fatigue, change in appetite Eyes: No visual changes, double vision, eye pain Ear, nose and throat: No hearing loss, ear pain, nasal  congestion, sore throat Cardiovascular: No chest pain, palpitations Respiratory:  No shortness of breath at rest or with exertion, wheezes GastrointestinaI: No nausea, vomiting, diarrhea, abdominal pain, fecal incontinence Genitourinary:  No dysuria, urinary retention or frequency Musculoskeletal:  No neck pain, back pain Integumentary: No rash, pruritus, skin lesions Neurological: as above Psychiatric: No depression, insomnia, anxiety Endocrine: No palpitations, fatigue, diaphoresis, mood swings, change in appetite, change in weight, increased thirst Hematologic/Lymphatic:  No anemia, purpura, petechiae. Allergic/Immunologic: no itchy/runny eyes, nasal congestion, recent allergic reactions, rashes  PHYSICAL EXAM: Vitals:   07/06/17 1315  BP: 128/84  Pulse: 76  SpO2: 97%   General: No acute distress Head:  Normocephalic/atraumatic Eyes: Fundoscopic exam shows bilateral sharp discs, no vessel changes, exudates, or hemorrhages Neck: supple, no paraspinal tenderness, full range of motion Back: No paraspinal tenderness Heart: regular rate and rhythm Lungs: Clear to auscultation bilaterally. Vascular: No carotid bruits. Skin/Extremities: No rash, no edema Neurological Exam: Mental status: alert and oriented to person, place, and time, no dysarthria or aphasia, Fund of knowledge is appropriate.  Recent and remote memory are intact.  Attention and concentration are normal.    Able to name objects and repeat phrases. Cranial nerves: CN I: not tested CN II: pupils equal, round and reactive to light, visual fields intact, fundi unremarkable. CN III, IV, VI:  full range of motion, no nystagmus, no ptosis CN V: facial sensation intact CN VII: upper and lower face symmetric CN VIII: hearing intact to finger rub CN IX, X: gag intact, uvula midline CN XI: sternocleidomastoid and trapezius muscles intact CN XII: tongue midline Bulk & Tone: normal, no fasciculations. Motor: 5/5 throughout  with no pronator drift. Sensation: intact to light touch, cold, pin, vibration and joint position sense.  No extinction to double simultaneous stimulation.  Romberg test negative Deep Tendon Reflexes: +2 throughout, no ankle clonus Plantar responses: downgoing bilaterally Cerebellar: no incoordination on finger to nose, heel to shin. No dysdiadochokinesia Gait: narrow-based and steady, able to tandem walk adequately. Tremor: none  IMPRESSION: This is a 51 year old right-handed woman presenting with left-sided numbness from the left parietal region down the left side of her neck to the shoulder region. This comes and goes, and is not present today. She also reports a sensation inside her left ear. Neurological exam normal. The etiology of symptoms is unclear, it may relate to possible radiculopathy, however the cause of facial/head symptoms are not typical of radiculopathy. Upper cervical lesions can cause facial symptoms. MRI brain with and without contrast and cervical spine without contrast will be ordered to assess for underlying structural abnormality. We discussed how PT may help, she is interested in trying a medication. We discussed symptomatic treatment with gabapentin for paresthesias, she will try low dose 100mg  qhs. Side effects were discussed. She will follow-up in 3-4 months and  knows to call for any changes.   Thank you for allowing me to participate in the care of this patient. Please do not hesitate to call for any questions or concerns.   Ellouise Newer, M.D.  CC: Dr. Maudie Figueroa

## 2017-07-06 NOTE — Patient Instructions (Addendum)
1. Schedule MRI brain with and without contrast 2. Schedule MRI cervical spine without contrast  We have sent a referral to [facility] for your MRI and they will call you directly to schedule your appt. They are located at [place]. If you need to contact them directly please call [phone].   3. Start gabapentin 100mg : take 1 capsule every night 4. Our office will call with results, follow-up in 3-4 months, call for any changes

## 2017-07-20 ENCOUNTER — Encounter: Payer: Self-pay | Admitting: *Deleted
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
## 2017-07-24 ENCOUNTER — Encounter: Payer: Self-pay | Admitting: *Deleted

## 2017-07-24 DIAGNOSIS — Z23 Encounter for immunization: Secondary | ICD-10-CM

## 2017-07-28 ENCOUNTER — Encounter: Payer: Self-pay | Admitting: Family Medicine

## 2017-08-15 ENCOUNTER — Other Ambulatory Visit: Payer: BLUE CROSS/BLUE SHIELD

## 2017-08-15 ENCOUNTER — Ambulatory Visit
Admission: RE | Admit: 2017-08-15 | Discharge: 2017-08-15 | Disposition: A | Discharge status: Home / Self Care | Payer: BLUE CROSS/BLUE SHIELD | Source: Ambulatory Visit | Attending: Neurology | Admitting: Neurology

## 2017-08-15 DIAGNOSIS — M542 Cervicalgia: Secondary | ICD-10-CM

## 2017-08-15 MED ORDER — GADOBENATE DIMEGLUMINE 529 MG/ML IV SOLN
10.0000 mL | Freq: Once | INTRAVENOUS | Status: AC | PRN
  Administered 2017-08-15: 10 mL via INTRAVENOUS

## 2017-08-16 ENCOUNTER — Telehealth: Payer: Self-pay

## 2017-08-16 NOTE — Telephone Encounter (Signed)
-----   Message from Cameron Sprang, MD sent at 08/15/2017  4:37 PM EDT ----- Pls let patient/daughter know the MRI brain is normal, no evidence of tumor, stroke, or bleed. The MRI of her neck did show mild arthritis changes, more on the left side, with narrowing where one of the nerves comes out. Symptoms likely from pinched nerve, does she want to do physical therapy for this now? thanks

## 2017-08-16 NOTE — Telephone Encounter (Signed)
Message below relayed via MyChart.  

## 2017-09-16 ENCOUNTER — Other Ambulatory Visit: Payer: BLUE CROSS/BLUE SHIELD

## 2017-10-19 ENCOUNTER — Encounter: Payer: Self-pay | Admitting: *Deleted

## 2017-10-21 NOTE — Progress Notes (Signed)
HPI:  Here for CPE:  -Concerns and/or follow up today: Depression screen was positive, but she reports with interpreter to me that she did not understand the questions and denies depression. Reports occ stress with work but no signs or symptoms of depression.  -Diet: variety of foods, balance and well rounded, larger portion sizes -Exercise: no regular exercise -Taking folic acid, vitamin D or calcium: no -Diabetes and Dyslipidemia Screening: fasting for labs -Vaccines: see vaccine section EPIC -pap history: neg with hpv neg 09/2016 -FDLMP: see nursing notes -sexual activity: yes, female partner, no new partners -wants STI testing (Hep C if born [birth year]): yes -FH breast, colon or ovarian ca: see FH Last mammogram: last mammo 10/2016 bir[facility] 1 Last colon cancer screening: done and neg in 2009 per pt report Breast Ca Risk Assessment: see family history and pt history DEXA (>/= 65): n/a  -Alcohol, Tobacco, drug use: see social history  Review of Systems - no fevers, unintentional weight loss, vision loss, hearing loss, chest pain, sob, hemoptysis, melena, hematochezia, hematuria, genital discharge, changing or concerning skin lesions, bleeding, bruising, loc, thoughts of self harm or SI  Past Medical History:  Diagnosis Date  . GERD (gastroesophageal reflux disease)   . Helicobacter pylori ab+ 2009   treated in [place]     Past Surgical History:  Procedure Laterality Date  . bartholin gland    . COLONOSCOPY  2009   [place] - ok  . ESOPHAGOGASTRODUODENOSCOPY  2009   [place] - ulcer scar  . OVARIAN CYST SURGERY Bilateral     History reviewed. No pertinent family history.  Social History   Socioeconomic History  . Marital status: Married    Spouse name: None  . Number of children: 2  . Years of education: None  . Highest education level: None  Social Needs  . Financial resource strain: None  . Food insecurity - worry: None  . Food insecurity - inability: None  .  Transportation needs - medical: None  . Transportation needs - non-medical: None  Occupational History  . Occupation: tailor    Employer: [employer]  Tobacco Use  . Smoking status: Never Smoker  . Smokeless tobacco: Never Used  Substance and Sexual Activity  . Alcohol use: No  . Drug use: No  . Sexual activity: No  Other Topics Concern  . None  Social History Narrative   Married, 1 son (45 yo)one daughter (31) in 2014   Graded from [place]   Works as a tailor    Caffeine   02/20/2013   No regular exercise; diet if fair                     Current Outpatient Medications:  .  Esomeprazole Magnesium (NEXIUM PO), Take by mouth., Disp: , Rfl:  .  gabapentin (NEURONTIN) 100 MG capsule, Take 1 capsule at night, Disp: 30 capsule, Rfl: 6  EXAM:  Vitals:   10/24/17 0811  BP: 120/70  Pulse: 66  Temp: 98 F (36.7 C)    GENERAL: vitals reviewed and listed below, alert, oriented, appears well hydrated and in no acute distress  HEENT: head atraumatic, PERRLA, normal appearance of eyes, ears, nose and mouth. moist mucus membranes.  NECK: supple, no masses or lymphadenopathy  LUNGS: clear to auscultation bilaterally, no rales, rhonchi or wheeze  CV: HRRR, no peripheral edema or cyanosis, normal pedal pulses  ABDOMEN: bowel sounds normal, soft, non tender to palpation, no masses, no rebound or guarding  GU/BREAST:  normal breast exam, declined pelvic  SKIN: no rash or abnormal lesions  MS: normal gait, moves all extremities normally  NEURO: normal gait, speech and thought processing grossly intact, muscle tone grossly intact throughout  PSYCH: normal affect, pleasant and cooperative  ASSESSMENT AND PLAN:  Discussed the following assessment and plan:  PREVENTIVE EXAM: -Discussed and advised all [place] preventive services health task force level A and B recommendations for age, sex and risks. -pt agrees to schedule mammogram -Advised at least 150 minutes of  exercise per week and a healthy diet with avoidance of (less then 1 serving per week) processed foods, white starches, red meat, fast foods and sweets and consisting of: * 5-9 servings of fresh fruits and vegetables (not corn or potatoes) *nuts and seeds, beans *olives and olive oil *lean meats such as fish and white chicken  *whole grains -labs, studies and vaccines per orders this encounter  Depression screening: pt did not understand screen with language barrier, on interview with interpreter - no depression   Patient advised to return to clinic immediately if symptoms worsen or persist or new concerns.  Patient Instructions  BEFORE YOU LEAVE: -labs -follow up: yearly for physical  Please call today to schedule yearly mammogram.  We have ordered labs or studies at this visit. It can take up to 1-2 weeks for results and processing. IF results require follow up or explanation, we will call you with instructions. Clinically stable results will be released to your [facility]. If you have not heard from [place] or cannot find your results in [facility] in 2 weeks please contact our office at [phone].  If you are not yet signed up for [facility], please consider signing up.        Health Maintenance for Postmenopausal Women Menopause is a normal process in which your reproductive ability comes to an end. This process happens gradually over a span of months to years, usually between the ages of 67 and 8. Menopause is complete when you have missed 12 consecutive menstrual periods. It is important to talk with your health care provider about some of the most common conditions that affect postmenopausal women, such as heart disease, cancer, and bone loss (osteoporosis). Adopting a healthy lifestyle and getting preventive care can help to promote your health and wellness. Those actions can also lower your chances of developing some of these common conditions. What should I know about  menopause? During menopause, you may experience a number of symptoms, such as:  Moderate-to-severe hot flashes.  Night sweats.  Decrease in sex drive.  Mood swings.  Headaches.  Tiredness.  Irritability.  Memory problems.  Insomnia.  Choosing to treat or not to treat menopausal changes is an individual decision that you make with your health care provider. What should I know about hormone replacement therapy and supplements? Hormone therapy products are effective for treating symptoms that are associated with menopause, such as hot flashes and night sweats. Hormone replacement carries certain risks, especially as you become older. If you are thinking about using estrogen or estrogen with progestin treatments, discuss the benefits and risks with your health care provider. What should I know about heart disease and stroke? Heart disease, heart attack, and stroke become more likely as you age. This may be due, in part, to the hormonal changes that your body experiences during menopause. These can affect how your body processes dietary fats, triglycerides, and cholesterol. Heart attack and stroke are both medical emergencies. There are many things that you can  do to help prevent heart disease and stroke:  Have your blood pressure checked at least every 1-2 years. High blood pressure causes heart disease and increases the risk of stroke.  If you are 55-17 years old, ask your health care provider if you should take aspirin to prevent a heart attack or a stroke.  Do not use any tobacco products, including cigarettes, chewing tobacco, or electronic cigarettes. If you need help quitting, ask your health care provider.  It is important to eat a healthy diet and maintain a healthy weight. ? Be sure to include plenty of vegetables, fruits, low-fat dairy products, and lean protein. ? Avoid eating foods that are high in solid fats, added sugars, or salt (sodium).  Get regular exercise. This  is one of the most important things that you can do for your health. ? Try to exercise for at least 150 minutes each week. The type of exercise that you do should increase your heart rate and make you sweat. This is known as moderate-intensity exercise. ? Try to do strengthening exercises at least twice each week. Do these in addition to the moderate-intensity exercise.  Know your numbers.Ask your health care provider to check your cholesterol and your blood glucose. Continue to have your blood tested as directed by your health care provider.  What should I know about cancer screening? There are several types of cancer. Take the following steps to reduce your risk and to catch any cancer development as early as possible. Breast Cancer  Practice breast self-awareness. ? This means understanding how your breasts normally appear and feel. ? It also means doing regular breast self-exams. Let your health care provider know about any changes, no matter how small.  If you are 42 or older, have a clinician do a breast exam (clinical breast exam or CBE) every year. Depending on your age, family history, and medical history, it may be recommended that you also have a yearly breast X-ray (mammogram).  If you have a family history of breast cancer, talk with your health care provider about genetic screening.  If you are at high risk for breast cancer, talk with your health care provider about having an MRI and a mammogram every year.  Breast cancer (BRCA) gene test is recommended for women who have family members with BRCA-related cancers. Results of the assessment will determine the need for genetic counseling and BRCA1 and for BRCA2 testing. BRCA-related cancers include these types: ? Breast. This occurs in males or females. ? Ovarian. ? Tubal. This may also be called fallopian tube cancer. ? Cancer of the abdominal or pelvic lining (peritoneal cancer). ? Prostate. ? Pancreatic.  Cervical,  Uterine, and Ovarian Cancer Your health care provider may recommend that you be screened regularly for cancer of the pelvic organs. These include your ovaries, uterus, and vagina. This screening involves a pelvic exam, which includes checking for microscopic changes to the surface of your cervix (Pap test).  For women ages 21-65, health care providers may recommend a pelvic exam and a Pap test every three years. For women ages 63-65, they may recommend the Pap test and pelvic exam, combined with testing for human papilloma virus (HPV), every five years. Some types of HPV increase your risk of cervical cancer. Testing for HPV may also be done on women of any age who have unclear Pap test results.  Other health care providers may not recommend any screening for nonpregnant women who are considered low risk for pelvic cancer and  have no symptoms. Ask your health care provider if a screening pelvic exam is right for you.  If you have had past treatment for cervical cancer or a condition that could lead to cancer, you need Pap tests and screening for cancer for at least 20 years after your treatment. If Pap tests have been discontinued for you, your risk factors (such as having a new sexual partner) need to be reassessed to determine if you should start having screenings again. Some women have medical problems that increase the chance of getting cervical cancer. In these cases, your health care provider may recommend that you have screening and Pap tests more often.  If you have a family history of uterine cancer or ovarian cancer, talk with your health care provider about genetic screening.  If you have vaginal bleeding after reaching menopause, tell your health care provider.  There are currently no reliable tests available to screen for ovarian cancer.  Lung Cancer Lung cancer screening is recommended for adults 71-38 years old who are at high risk for lung cancer because of a history of smoking. A  yearly low-dose CT scan of the lungs is recommended if you:  Currently smoke.  Have a history of at least 30 pack-years of smoking and you currently smoke or have quit within the past 15 years. A pack-year is smoking an average of one pack of cigarettes per day for one year.  Yearly screening should:  Continue until it has been 15 years since you quit.  Stop if you develop a health problem that would prevent you from having lung cancer treatment.  Colorectal Cancer  This type of cancer can be detected and can often be prevented.  Routine colorectal cancer screening usually begins at age 61 and continues through age 37.  If you have risk factors for colon cancer, your health care provider may recommend that you be screened at an earlier age.  If you have a family history of colorectal cancer, talk with your health care provider about genetic screening.  Your health care provider may also recommend using home test kits to check for hidden blood in your stool.  A small camera at the end of a tube can be used to examine your colon directly (sigmoidoscopy or colonoscopy). This is done to check for the earliest forms of colorectal cancer.  Direct examination of the colon should be repeated every 5-10 years until age 6. However, if early forms of precancerous polyps or small growths are found or if you have a family history or genetic risk for colorectal cancer, you may need to be screened more often.  Skin Cancer  Check your skin from head to toe regularly.  Monitor any moles. Be sure to tell your health care provider: ? About any new moles or changes in moles, especially if there is a change in a mole's shape or color. ? If you have a mole that is larger than the size of a pencil eraser.  If any of your family members has a history of skin cancer, especially at a young age, talk with your health care provider about genetic screening.  Always use sunscreen. Apply sunscreen liberally  and repeatedly throughout the day.  Whenever you are outside, protect yourself by wearing long sleeves, pants, a wide-brimmed hat, and sunglasses.  What should I know about osteoporosis? Osteoporosis is a condition in which bone destruction happens more quickly than new bone creation. After menopause, you may be at an increased risk for  osteoporosis. To help prevent osteoporosis or the bone fractures that can happen because of osteoporosis, the following is recommended:  If you are 29-14 years old, get at least 1,000 mg of calcium and at least 600 mg of vitamin D per day.  If you are older than age 55 but younger than age 26, get at least 1,200 mg of calcium and at least 600 mg of vitamin D per day.  If you are older than age 62, get at least 1,200 mg of calcium and at least 800 mg of vitamin D per day.  Smoking and excessive alcohol intake increase the risk of osteoporosis. Eat foods that are rich in calcium and vitamin D, and do weight-bearing exercises several times each week as directed by your health care provider. What should I know about how menopause affects my mental health? Depression may occur at any age, but it is more common as you become older. Common symptoms of depression include:  Low or sad mood.  Changes in sleep patterns.  Changes in appetite or eating patterns.  Feeling an overall lack of motivation or enjoyment of activities that you previously enjoyed.  Frequent crying spells.  Talk with your health care provider if you think that you are experiencing depression. What should I know about immunizations? It is important that you get and maintain your immunizations. These include:  Tetanus, diphtheria, and pertussis (Tdap) booster vaccine.  Influenza every year before the flu season begins.  Pneumonia vaccine.  Shingles vaccine.  Your health care provider may also recommend other immunizations. This information is not intended to replace advice given to you  by your health care provider. Make sure you discuss any questions you have with your health care provider. Document Released: 12/17/2005 Document Revised: 05/14/2016 Document Reviewed: 07/29/2015 Elsevier Interactive Patient Education  2018 [employer].     No Follow-up on file.  Colin Benton R., DO

## 2017-10-24 ENCOUNTER — Encounter: Payer: Self-pay | Admitting: Family Medicine

## 2017-10-24 ENCOUNTER — Ambulatory Visit (INDEPENDENT_AMBULATORY_CARE_PROVIDER_SITE_OTHER): Payer: BLUE CROSS/BLUE SHIELD | Admitting: Family Medicine

## 2017-10-24 VITALS — BP 120/70 | HR 66 | Temp 98.0°F | Ht 61.0 in | Wt 120.6 lb

## 2017-10-24 DIAGNOSIS — Z9189 Other specified personal risk factors, not elsewhere classified: Secondary | ICD-10-CM | POA: Diagnosis not present

## 2017-10-24 DIAGNOSIS — Z0000 Encounter for general adult medical examination without abnormal findings: Secondary | ICD-10-CM | POA: Diagnosis not present

## 2017-10-24 DIAGNOSIS — Z1331 Encounter for screening for depression: Secondary | ICD-10-CM | POA: Diagnosis not present

## 2017-10-24 DIAGNOSIS — Z1159 Encounter for screening for other viral diseases: Secondary | ICD-10-CM | POA: Diagnosis not present

## 2017-10-24 LAB — LIPID PANEL
Cholesterol: 195 mg/dL (ref 0–200)
HDL: 59.7 mg/dL (ref >39.00)
LDL Cholesterol: 121 mg/dL — ABNORMAL HIGH (ref 0–99)
NONHDL: 135.63
TRIGLYCERIDES: 74 mg/dL (ref 0.0–149.0)
Total CHOL/HDL Ratio: 3
VLDL: 14.8 mg/dL (ref 0.0–40.0)

## 2017-10-24 LAB — HEMOGLOBIN A1C: Hgb A1c MFr Bld: 5.4 % (ref 4.6–6.5)

## 2017-10-24 NOTE — Patient Instructions (Signed)
BEFORE YOU LEAVE: -labs -follow up: yearly for physical  Please call today to schedule yearly mammogram.  We have ordered labs or studies at this visit. It can take up to 1-2 weeks for results and processing. IF results require follow up or explanation, we will call you with instructions. Clinically stable results will be released to your [facility]. If you have not heard from [place] or cannot find your results in [facility] in 2 weeks please contact our office at [phone].  If you are not yet signed up for [facility], please consider signing up.        Health Maintenance for Postmenopausal Women Menopause is a normal process in which your reproductive ability comes to an end. This process happens gradually over a span of months to years, usually between the ages of 91 and 6. Menopause is complete when you have missed 12 consecutive menstrual periods. It is important to talk with your health care provider about some of the most common conditions that affect postmenopausal women, such as heart disease, cancer, and bone loss (osteoporosis). Adopting a healthy lifestyle and getting preventive care can help to promote your health and wellness. Those actions can also lower your chances of developing some of these common conditions. What should I know about menopause? During menopause, you may experience a number of symptoms, such as:  Moderate-to-severe hot flashes.  Night sweats.  Decrease in sex drive.  Mood swings.  Headaches.  Tiredness.  Irritability.  Memory problems.  Insomnia.  Choosing to treat or not to treat menopausal changes is an individual decision that you make with your health care provider. What should I know about hormone replacement therapy and supplements? Hormone therapy products are effective for treating symptoms that are associated with menopause, such as hot flashes and night sweats. Hormone replacement carries certain risks, especially as you become older. If  you are thinking about using estrogen or estrogen with progestin treatments, discuss the benefits and risks with your health care provider. What should I know about heart disease and stroke? Heart disease, heart attack, and stroke become more likely as you age. This may be due, in part, to the hormonal changes that your body experiences during menopause. These can affect how your body processes dietary fats, triglycerides, and cholesterol. Heart attack and stroke are both medical emergencies. There are many things that you can do to help prevent heart disease and stroke:  Have your blood pressure checked at least every 1-2 years. High blood pressure causes heart disease and increases the risk of stroke.  If you are 20-76 years old, ask your health care provider if you should take aspirin to prevent a heart attack or a stroke.  Do not use any tobacco products, including cigarettes, chewing tobacco, or electronic cigarettes. If you need help quitting, ask your health care provider.  It is important to eat a healthy diet and maintain a healthy weight. ? Be sure to include plenty of vegetables, fruits, low-fat dairy products, and lean protein. ? Avoid eating foods that are high in solid fats, added sugars, or salt (sodium).  Get regular exercise. This is one of the most important things that you can do for your health. ? Try to exercise for at least 150 minutes each week. The type of exercise that you do should increase your heart rate and make you sweat. This is known as moderate-intensity exercise. ? Try to do strengthening exercises at least twice each week. Do these in addition to the moderate-intensity exercise.  Know your numbers.Ask your health care provider to check your cholesterol and your blood glucose. Continue to have your blood tested as directed by your health care provider.  What should I know about cancer screening? There are several types of cancer. Take the following steps to  reduce your risk and to catch any cancer development as early as possible. Breast Cancer  Practice breast self-awareness. ? This means understanding how your breasts normally appear and feel. ? It also means doing regular breast self-exams. Let your health care provider know about any changes, no matter how small.  If you are 28 or older, have a clinician do a breast exam (clinical breast exam or CBE) every year. Depending on your age, family history, and medical history, it may be recommended that you also have a yearly breast X-ray (mammogram).  If you have a family history of breast cancer, talk with your health care provider about genetic screening.  If you are at high risk for breast cancer, talk with your health care provider about having an MRI and a mammogram every year.  Breast cancer (BRCA) gene test is recommended for women who have family members with BRCA-related cancers. Results of the assessment will determine the need for genetic counseling and BRCA1 and for BRCA2 testing. BRCA-related cancers include these types: ? Breast. This occurs in males or females. ? Ovarian. ? Tubal. This may also be called fallopian tube cancer. ? Cancer of the abdominal or pelvic lining (peritoneal cancer). ? Prostate. ? Pancreatic.  Cervical, Uterine, and Ovarian Cancer Your health care provider may recommend that you be screened regularly for cancer of the pelvic organs. These include your ovaries, uterus, and vagina. This screening involves a pelvic exam, which includes checking for microscopic changes to the surface of your cervix (Pap test).  For women ages 21-65, health care providers may recommend a pelvic exam and a Pap test every three years. For women ages 24-65, they may recommend the Pap test and pelvic exam, combined with testing for human papilloma virus (HPV), every five years. Some types of HPV increase your risk of cervical cancer. Testing for HPV may also be done on women of any  age who have unclear Pap test results.  Other health care providers may not recommend any screening for nonpregnant women who are considered low risk for pelvic cancer and have no symptoms. Ask your health care provider if a screening pelvic exam is right for you.  If you have had past treatment for cervical cancer or a condition that could lead to cancer, you need Pap tests and screening for cancer for at least 20 years after your treatment. If Pap tests have been discontinued for you, your risk factors (such as having a new sexual partner) need to be reassessed to determine if you should start having screenings again. Some women have medical problems that increase the chance of getting cervical cancer. In these cases, your health care provider may recommend that you have screening and Pap tests more often.  If you have a family history of uterine cancer or ovarian cancer, talk with your health care provider about genetic screening.  If you have vaginal bleeding after reaching menopause, tell your health care provider.  There are currently no reliable tests available to screen for ovarian cancer.  Lung Cancer Lung cancer screening is recommended for adults 73-60 years old who are at high risk for lung cancer because of a history of smoking. A yearly low-dose CT scan of the  lungs is recommended if you:  Currently smoke.  Have a history of at least 30 pack-years of smoking and you currently smoke or have quit within the past 15 years. A pack-year is smoking an average of one pack of cigarettes per day for one year.  Yearly screening should:  Continue until it has been 15 years since you quit.  Stop if you develop a health problem that would prevent you from having lung cancer treatment.  Colorectal Cancer  This type of cancer can be detected and can often be prevented.  Routine colorectal cancer screening usually begins at age 38 and continues through age 14.  If you have risk factors  for colon cancer, your health care provider may recommend that you be screened at an earlier age.  If you have a family history of colorectal cancer, talk with your health care provider about genetic screening.  Your health care provider may also recommend using home test kits to check for hidden blood in your stool.  A small camera at the end of a tube can be used to examine your colon directly (sigmoidoscopy or colonoscopy). This is done to check for the earliest forms of colorectal cancer.  Direct examination of the colon should be repeated every 5-10 years until age 39. However, if early forms of precancerous polyps or small growths are found or if you have a family history or genetic risk for colorectal cancer, you may need to be screened more often.  Skin Cancer  Check your skin from head to toe regularly.  Monitor any moles. Be sure to tell your health care provider: ? About any new moles or changes in moles, especially if there is a change in a mole's shape or color. ? If you have a mole that is larger than the size of a pencil eraser.  If any of your family members has a history of skin cancer, especially at a young age, talk with your health care provider about genetic screening.  Always use sunscreen. Apply sunscreen liberally and repeatedly throughout the day.  Whenever you are outside, protect yourself by wearing long sleeves, pants, a wide-brimmed hat, and sunglasses.  What should I know about osteoporosis? Osteoporosis is a condition in which bone destruction happens more quickly than new bone creation. After menopause, you may be at an increased risk for osteoporosis. To help prevent osteoporosis or the bone fractures that can happen because of osteoporosis, the following is recommended:  If you are 26-79 years old, get at least 1,000 mg of calcium and at least 600 mg of vitamin D per day.  If you are older than age 35 but younger than age 60, get at least 1,200 mg of  calcium and at least 600 mg of vitamin D per day.  If you are older than age 40, get at least 1,200 mg of calcium and at least 800 mg of vitamin D per day.  Smoking and excessive alcohol intake increase the risk of osteoporosis. Eat foods that are rich in calcium and vitamin D, and do weight-bearing exercises several times each week as directed by your health care provider. What should I know about how menopause affects my mental health? Depression may occur at any age, but it is more common as you become older. Common symptoms of depression include:  Low or sad mood.  Changes in sleep patterns.  Changes in appetite or eating patterns.  Feeling an overall lack of motivation or enjoyment of activities that you previously  enjoyed.  Frequent crying spells.  Talk with your health care provider if you think that you are experiencing depression. What should I know about immunizations? It is important that you get and maintain your immunizations. These include:  Tetanus, diphtheria, and pertussis (Tdap) booster vaccine.  Influenza every year before the flu season begins.  Pneumonia vaccine.  Shingles vaccine.  Your health care provider may also recommend other immunizations. This information is not intended to replace advice given to you by your health care provider. Make sure you discuss any questions you have with your health care provider. Document Released: 12/17/2005 Document Revised: 05/14/2016 Document Reviewed: 07/29/2015 Elsevier Interactive Patient Education  2018 [employer].

## 2017-10-25 LAB — HEPATITIS C ANTIBODY
HEP C AB: NONREACTIVE
SIGNAL TO CUT-OFF: 0.01 (ref <1.00)

## 2017-10-25 LAB — HIV ANTIBODY (ROUTINE TESTING W REFLEX): HIV 1&2 Ab, 4th Generation: NONREACTIVE

## 2017-10-31 NOTE — Congregational Nurse Program (Unsigned)
Congregational Nurse Program Note  Date of Encounter: 10/19/2017  Past Medical History: Past Medical History:  Diagnosis Date  . GERD (gastroesophageal reflux disease)   . Helicobacter pylori ab+ 2009   treated in [place]     Encounter Details:  Visited  To her business place to check pt.  Looks good and no c/o voiced. Spiritual care done

## 2017-11-07 ENCOUNTER — Ambulatory Visit: Payer: BLUE CROSS/BLUE SHIELD | Admitting: Neurology

## 2017-11-17 ENCOUNTER — Encounter: Payer: Self-pay | Admitting: Family Medicine

## 2017-12-10 NOTE — Congregational Nurse Program (Unsigned)
Congregational Nurse Program Note  Date of Encounter: 01/02/2018  Past Medical History: Past Medical History:  Diagnosis Date  . GERD (gastroesophageal reflux disease)   . Helicobacter pylori ab+ 2009   treated in [place]     Encounter Details: CNP Questionnaire - 12/05/17 0830      Questionnaire   Patient Status  Immigrant    Race  Asian    Location Patient Served At  Not Applicable visit her home   visit her home   [place]  No food insecurities    Housing/Utilities  No permanent housing    Transportation  No transportation needs    Interpersonal Safety  Yes, feel physically and emotionally safe where you currently live    Medication  No medication insecurities    Medical Provider  Yes    Referrals  Not Applicable    ED Visit Averted  Not Applicable       Pt wanted make an appointment with GI for colonoscopy was due, also stomach pain, wanted check endoscopy if possible. She was on the nexium from over the count drug. Called and made appointment with Dr Celesta Aver PA on 12/14/2017 9am.

## 2017-12-14 ENCOUNTER — Encounter: Payer: Self-pay | Admitting: Physician Assistant

## 2017-12-14 ENCOUNTER — Ambulatory Visit: Payer: BLUE CROSS/BLUE SHIELD | Admitting: Physician Assistant

## 2017-12-14 VITALS — BP 108/70 | HR 64 | Ht 62.0 in | Wt 117.0 lb

## 2017-12-14 DIAGNOSIS — M546 Pain in thoracic spine: Secondary | ICD-10-CM

## 2017-12-14 DIAGNOSIS — G8929 Other chronic pain: Secondary | ICD-10-CM | POA: Diagnosis not present

## 2017-12-14 DIAGNOSIS — Z1211 Encounter for screening for malignant neoplasm of colon: Secondary | ICD-10-CM | POA: Diagnosis not present

## 2017-12-14 DIAGNOSIS — K219 Gastro-esophageal reflux disease without esophagitis: Secondary | ICD-10-CM | POA: Diagnosis not present

## 2017-12-14 DIAGNOSIS — R1013 Epigastric pain: Secondary | ICD-10-CM | POA: Diagnosis not present

## 2017-12-14 DIAGNOSIS — Z1212 Encounter for screening for malignant neoplasm of rectum: Secondary | ICD-10-CM | POA: Diagnosis not present

## 2017-12-14 MED ORDER — DICYCLOMINE HCL 10 MG PO CAPS: 10.0000 mg | ORAL_CAPSULE | Freq: Four times a day (QID) | ORAL | 2 refills | Status: DC | PRN

## 2017-12-14 MED ORDER — ESOMEPRAZOLE MAGNESIUM 20 MG PO CPDR: 20.0000 mg | DELAYED_RELEASE_CAPSULE | Freq: Every day | ORAL | 2 refills | Status: AC

## 2017-12-14 NOTE — Patient Instructions (Addendum)
You have been scheduled for an endoscopy and colonoscopy. Please follow the written instructions given to you at your visit today. Please pick up your prep supplies at the pharmacy within the next 1-3 days. If you use inhalers (even only as needed), please bring them with you on the day of your procedure. Your physician has requested that you go to www.startemmi.com and enter the access code given to you at your visit today. This web site gives a general overview about your procedure. However, you should still follow specific instructions given to you by our office regarding your preparation for the procedure.  We have sent the following medications to your pharmacy for you to pick up at your convenience: Dicyclomine 10 mg four times a day as needed for abd pain  Nexium 20 mg twice a day   We have given you a handout on anti-reflux.

## 2017-12-14 NOTE — Progress Notes (Addendum)
Chief Complaint: Screening for colorectal cancer, epigastric pain and reflux  HPI:    Courtney Figueroa is a 52 year old [place] female with a past medical history as listed below, who was referred to me by Lucretia Kern, DO for a complaint of epigastric pain and reflux as well as need for screening colonoscopy.      Patient was last seen in clinic 09/06/16 by Dr. Carlean Purl and at that time described some abdominal pain and back issues.  It was described that she had a negative workup in 2014 with negative labs, ultrasound and EGD and had been on a PPI since.  She had recently had normal labs.  Her last negative colonoscopy was noted in 2009 by patient report in [place].  At that time it was thought that her pain could be functional/musculoskeletal and she was tried on dicyclomine as needed.    Today, the patient presents to clinic accompanied by an interpreter and explains that over the past 5 years she has continued with some epigastric pain that radiates through to her back.  It sounds as though this may has increased in frequency and intensity over the past year or so.  Patient does admit to stopping her Nexium at one point because things were "feeling better", but then started with more severe symptoms and started back with Nexium 20 mg twice daily about a week ago.  Patient tells me that even on this medication she does not feel "perfect".  Patient notes that her stomach pain is worse when she is on an "empty stomach".  Patient also describes some reflux of an acid material into her mouth.  She is worried about damage from this over the past 5 years.  Patient does remember being on a medicine to "relax my stomach", she remembers this helping in the past.    Patient also describes that her last colonoscopy was at least 10 years ago in [place], she would like to have a repeat screening at this time.    Patient denies fever, chills, blood in her stool, melena, weight loss, anorexia, nausea, vomiting or symptoms that  awaken her at night.  Past Medical History:  Diagnosis Date  . GERD (gastroesophageal reflux disease)   . Helicobacter pylori ab+ 2009   treated in [place]     Past Surgical History:  Procedure Laterality Date  . bartholin gland    . COLONOSCOPY  2009   [place] - ok  . ESOPHAGOGASTRODUODENOSCOPY  2009   [place] - ulcer scar  . OVARIAN CYST SURGERY Bilateral     Current Outpatient Medications  Medication Sig Dispense Refill  . Esomeprazole Magnesium (NEXIUM PO) Take by mouth.     No current facility-administered medications for this visit.     Allergies as of 12/14/2017  . (No Known Allergies)    Family History: Negative for colon cancer or polyps  Social History   Socioeconomic History  . Marital status: Married    Spouse name: Not on file  . Number of children: 2  . Years of education: Not on file  . Highest education level: Not on file  Social Needs  . Financial resource strain: Not on file  . Food insecurity - worry: Not on file  . Food insecurity - inability: Not on file  . Transportation needs - medical: Not on file  . Transportation needs - non-medical: Not on file  Occupational History  . Occupation: tailor    Employer: [employer]  Tobacco Use  . Smoking  status: Never Smoker  . Smokeless tobacco: Never Used  Substance and Sexual Activity  . Alcohol use: No  . Drug use: No  . Sexual activity: No  Other Topics Concern  . Not on file  Social History Narrative   Married, 1 son (41 yo)one daughter (39) in 2014   Graded from [place]   Works as a tailor    Caffeine   02/20/2013   No regular exercise; diet if fair                    Review of Systems:    Constitutional: No weight loss, fever or chills Cardiovascular: No chest pain Respiratory: No SOB  Gastrointestinal: See HPI and otherwise negative   Physical Exam:  Vital signs: BP 108/70   Pulse 64   Ht 5\' 2"  (1.575 m)   Wt 117 lb (53.1 kg)   LMP 02/09/2013   BMI 21.40 kg/m    Constitutional:   Pleasant [place] female appears to be in NAD, Well developed, Well nourished, alert and cooperative Head:  Normocephalic and atraumatic. Eyes:   PEERL, EOMI. No icterus. Conjunctiva pink. Ears:  Normal auditory acuity. Neck:  Supple Throat: Oral cavity and pharynx without inflammation, swelling or lesion.  Respiratory: Respirations even and unlabored. Lungs clear to auscultation bilaterally.   No wheezes, crackles, or rhonchi.  Cardiovascular: Normal S1, S2. No MRG. Regular rate and rhythm. No peripheral edema, cyanosis or pallor.  Gastrointestinal:  Soft, nondistended, mild epigastric ttp. No rebound or guarding. Normal bowel sounds. No appreciable masses or hepatomegaly. Rectal:  Not performed.  Msk:  Symmetrical without gross deformities. Without edema, no deformity or joint abnormality.  Neurologic:  Alert and  oriented x4;  grossly normal neurologically.  Skin:   Dry and intact without significant lesions or rashes. Psychiatric: Demonstrates good judgement and reason without abnormal affect or behaviors.  NO recent labs or imaging.  Assessment: 1.  Epigastric pain: Correlates with reflux, worse on an empty stomach, some better with Nexium but not "perfect", last EGD in 2014 was negative, vague history of H. pylori treated in [place] a long time ago; consider functional dyspepsia versus gastritis versus H. pylori versus other 2.  GERD: Worse recently when the patient stopped Nexium, has been intermittent breakthrough over the past 5 years, patient worried regarding damage 3.  Chronic back pain: Patient has been complaining of mid scapular back pain since 2014, this is unchanged, seems related to her epigastric pain as this gets better with Nexium; consider gastritis versus functional pain 4.  Screening colonoscopy: Last negative in 2009 in [place] per patient report, due for repeat screening  Plan: 1.  Scheduled patient for EGD and colonoscopy with Dr. Carlean Purl and [place].   Did discuss risk, benefits, limitations and alternatives and patient agrees to proceed. 2.  Offered the patient an increased dose of Nexium but she would like to stay on 20 mg twice a day for now, explained that Dr. Carlean Purl can increase this as he sees fit after EGD. 3.  Patient requested a refill of Dicyclomine 10 mg 4 times a day #90 with 3 refills 4.  Reviewed an antireflux diet and lifestyle modifications. 5.  Patient to follow in clinic per recommendations from Dr. Carlean Purl after time of procedures.  Ellouise Newer, PA-C [facility] 12/14/2017, 9:12 AM  Cc: Lucretia Kern, DO   Agree with Ms. Romina Divirgilio's evaluation and management.  Gatha Mayer, MD, [facility]

## 2018-01-02 ENCOUNTER — Encounter: Payer: Self-pay | Admitting: *Deleted

## 2018-01-13 ENCOUNTER — Encounter: Payer: Self-pay | Admitting: Internal Medicine

## 2018-01-26 ENCOUNTER — Ambulatory Visit (AMBULATORY_SURGERY_CENTER): Payer: BLUE CROSS/BLUE SHIELD | Admitting: Internal Medicine

## 2018-01-26 ENCOUNTER — Encounter: Payer: Self-pay | Admitting: Internal Medicine

## 2018-01-26 ENCOUNTER — Other Ambulatory Visit: Payer: Self-pay

## 2018-01-26 VITALS — BP 117/71 | HR 59 | Temp 98.4°F | Resp 15 | Ht 62.0 in | Wt 117.0 lb

## 2018-01-26 DIAGNOSIS — R1013 Epigastric pain: Secondary | ICD-10-CM | POA: Diagnosis not present

## 2018-01-26 DIAGNOSIS — Z1211 Encounter for screening for malignant neoplasm of colon: Secondary | ICD-10-CM | POA: Diagnosis present

## 2018-01-26 DIAGNOSIS — G8929 Other chronic pain: Secondary | ICD-10-CM

## 2018-01-26 DIAGNOSIS — D123 Benign neoplasm of transverse colon: Secondary | ICD-10-CM

## 2018-01-26 MED ORDER — SODIUM CHLORIDE 0.9 % IV SOLN: 500.0000 mL | Freq: Once | INTRAVENOUS | Status: AC

## 2018-01-26 NOTE — Op Note (Signed)
[facility] Patient Name: Courtney Figueroa Procedure Date: 01/26/2018 2:13 PM MRN: [id number] Endoscopist: Gatha Mayer , MD Age: 52 Referring MD:  Date of Birth: 10-01-1966 Gender: Female Account #: [id number] Procedure:                Colonoscopy Indications:              Screening for colorectal malignant neoplasm, Last                            colonoscopy: 2009 Medicines:                Propofol per Anesthesia, Monitored Anesthesia Care Procedure:                Pre-Anesthesia Assessment:                           - Prior to the procedure, a History and Physical                            was performed, and patient medications and                            allergies were reviewed. The patient's tolerance of                            previous anesthesia was also reviewed. The risks                            and benefits of the procedure and the sedation                            options and risks were discussed with the patient.                            All questions were answered, and informed consent                            was obtained. Prior Anticoagulants: The patient has                            taken no previous anticoagulant or antiplatelet                            agents. ASA Grade Assessment: II - A patient with                            mild systemic disease. After reviewing the risks                            and benefits, the patient was deemed in                            satisfactory condition to undergo the procedure.  After obtaining informed consent, the colonoscope                            was passed under direct vision. Throughout the                            procedure, the patient's blood pressure, pulse, and                            oxygen saturations were monitored continuously. The                            Model PCF-[id number]) scope was introduced                            through the anus and  advanced to the the cecum,                            identified by appendiceal orifice and ileocecal                            valve. The colonoscopy was performed without                            difficulty. The patient tolerated the procedure                            well. The quality of the bowel preparation was                            excellent. The bowel preparation used was Miralax.                            The ileocecal valve, appendiceal orifice, and                            rectum were photographed. Scope In: 2:31:57 PM Scope Out: 2:43:04 PM Scope Withdrawal Time: 0 hours 8 minutes 18 seconds  Total Procedure Duration: 0 hours 11 minutes 7 seconds  Findings:                 The perianal and digital rectal examinations were                            normal.                           A 5 mm polyp was found in the distal transverse                            colon. The polyp was sessile. The polyp was removed                            with a cold snare. Resection and retrieval were  complete. Verification of patient identification                            for the specimen was done. Estimated blood loss was                            minimal.                           The exam was otherwise without abnormality on                            direct and retroflexion views. Complications:            No immediate complications. Estimated Blood Loss:     Estimated blood loss: none. Impression:               - One 5 mm polyp in the distal transverse colon,                            removed with a cold snare. Resected and retrieved.                           - The examination was otherwise normal on direct                            and retroflexion views. Recommendation:           - Patient has a contact number available for                            emergencies. The signs and symptoms of potential                            delayed  complications were discussed with the                            patient. Return to normal activities tomorrow.                            Written discharge instructions were provided to the                            patient.                           - Resume previous diet.                           - Continue present medications.                           - Repeat colonoscopy is recommended. The                            colonoscopy date will be determined after pathology  results from today's exam become available for                            review. Gatha Mayer, MD 01/26/2018 2:51:52 PM This report has been signed electronically.

## 2018-01-26 NOTE — Progress Notes (Signed)
Called to room to assist during endoscopic procedure.  Patient ID and intended procedure confirmed with present staff. Received instructions for my participation in the procedure from the performing physician.  

## 2018-01-26 NOTE — Op Note (Signed)
[facility] Patient Name: Courtney Figueroa Procedure Date: 01/26/2018 2:14 PM MRN: [id number] Endoscopist: Gatha Mayer , MD Age: 52 Referring MD:  Date of Birth: [birth year] Gender: Female Account #: [id number] Procedure:                Upper GI endoscopy Indications:              Epigastric abdominal pain Medicines:                Propofol per Anesthesia, Monitored Anesthesia Care Procedure:                Pre-Anesthesia Assessment:                           - Prior to the procedure, a History and Physical                            was performed, and patient medications and                            allergies were reviewed. The patient's tolerance of                            previous anesthesia was also reviewed. The risks                            and benefits of the procedure and the sedation                            options and risks were discussed with the patient.                            All questions were answered, and informed consent                            was obtained. Prior Anticoagulants: The patient has                            taken no previous anticoagulant or antiplatelet                            agents. ASA Grade Assessment: II - A patient with                            mild systemic disease. After reviewing the risks                            and benefits, the patient was deemed in                            satisfactory condition to undergo the procedure.                           After obtaining informed consent, the endoscope was  passed under direct vision. Throughout the                            procedure, the patient's blood pressure, pulse, and                            oxygen saturations were monitored continuously. The                            Endoscope was introduced through the mouth, and                            advanced to the second part of duodenum. The upper                            GI  endoscopy was accomplished without difficulty.                            The patient tolerated the procedure well. Scope In: Scope Out: Findings:                 The esophagus was normal.                           The stomach was normal.                           The examined duodenum was normal.                           The cardia and gastric fundus were normal on                            retroflexion. Complications:            No immediate complications. Estimated Blood Loss:     Estimated blood loss: none. Impression:               - Normal esophagus.                           - Normal stomach.                           - Normal examined duodenum.                           - No specimens collected. Recommendation:           - Patient has a contact number available for                            emergencies. The signs and symptoms of potential                            delayed complications were discussed with the  patient. Return to normal activities tomorrow.                            Written discharge instructions were provided to the                            patient.                           - Resume previous diet.                           - Continue present medications.                           - See the other procedure note for documentation of                            additional recommendations.                           - Try FD Donald Prose if dicyclomine not helping Gatha Mayer, MD 01/26/2018 2:49:56 PM This report has been signed electronically.

## 2018-01-26 NOTE — Progress Notes (Signed)
Report given to PACU, vss 

## 2018-01-26 NOTE — Patient Instructions (Addendum)
The esophagus, stomach and duodenum look ok.  I found and removed one small polyp from the colon. I will let you know pathology results and when to have another routine colonoscopy by mail and/or My Chart.  If the dicyclomine is not helping the stomach pains I suggest you try FD Donald Prose - you can get that over the counter at a pharmacy.  I appreciate the opportunity to care for you. Gatha Mayer, MD, [facility]   Discharge instructions given. Handout on polyps. Normal endoscopy. Resume previous medications. Interpreter in to translate information. YOU HAD AN ENDOSCOPIC PROCEDURE TODAY AT [facility]:   Refer to the procedure report that was given to you for any specific questions about what was found during the examination.  If the procedure report does not answer your questions, please call your gastroenterologist to clarify.  If you requested that your care partner not be given the details of your procedure findings, then the procedure report has been included in a sealed envelope for you to review at your convenience later.  YOU SHOULD EXPECT: Some feelings of bloating in the abdomen. Passage of more gas than usual.  Walking can help get rid of the air that was put into your GI tract during the procedure and reduce the bloating. If you had a lower endoscopy (such as a colonoscopy or flexible sigmoidoscopy) you may notice spotting of blood in your stool or on the toilet paper. If you underwent a bowel prep for your procedure, you may not have a normal bowel movement for a few days.  Please Note:  You might notice some irritation and congestion in your nose or some drainage.  This is from the oxygen used during your procedure.  There is no need for concern and it should clear up in a day or so.  SYMPTOMS TO REPORT IMMEDIATELY:   Following lower endoscopy (colonoscopy or flexible sigmoidoscopy):  Excessive amounts of blood in the stool  Significant tenderness or  worsening of abdominal pains  Swelling of the abdomen that is new, acute  Fever of 100F or higher   Following upper endoscopy (EGD)  Vomiting of blood or coffee ground material  New chest pain or pain under the shoulder blades  Painful or persistently difficult swallowing  New shortness of breath  Fever of 100F or higher  Black, tarry-looking stools  For urgent or emergent issues, a gastroenterologist can be reached at any hour by calling [phone].   DIET:  We do recommend a small meal at first, but then you may proceed to your regular diet.  Drink plenty of fluids but you should avoid alcoholic beverages for 24 hours.  ACTIVITY:  You should plan to take it easy for the rest of today and you should NOT DRIVE or use heavy machinery until tomorrow (because of the sedation medicines used during the test).    FOLLOW UP: Our staff will call the number listed on your records the next business day following your procedure to check on you and address any questions or concerns that you may have regarding the information given to you following your procedure. If we do not reach you, we will leave a message.  However, if you are feeling well and you are not experiencing any problems, there is no need to return our call.  We will assume that you have returned to your regular daily activities without incident.  If any biopsies were taken you will be contacted by phone or  by letter within the next 1-3 weeks.  Please call us at [phone] if you have not heard about the biopsies in 3 weeks.    SIGNATURES/CONFIDENTIALITY: You and/or your care partner have signed paperwork which will be entered into your electronic medical record.  These signatures attest to the fact that that the information above on your After Visit Summary has been reviewed and is understood.  Full responsibility of the confidentiality of this discharge information lies with you and/or your care-partner.

## 2018-01-27 ENCOUNTER — Telehealth: Payer: Self-pay | Admitting: *Deleted

## 2018-01-27 NOTE — Telephone Encounter (Signed)
  Follow up Call-  Call back number 01/26/2018  Post procedure Call Back phone  # [phone]  Permission to leave phone message Yes  Some recent data might be hidden     Patient questions:  Do you have a fever, pain , or abdominal swelling? No. Pain Score  0 *  Have you tolerated food without any problems? Yes.    Have you been able to return to your normal activities? Yes.    Do you have any questions about your discharge instructions: Diet   No. Medications  No. Follow up visit  No.  Do you have questions or concerns about your Care? No.  Actions: * If pain score is 4 or above: No action needed, pain <4.

## 2018-02-02 ENCOUNTER — Encounter: Payer: Self-pay | Admitting: Internal Medicine

## 2018-02-02 DIAGNOSIS — Z86010 Personal history of colonic polyps: Secondary | ICD-10-CM

## 2018-02-02 DIAGNOSIS — Z860101 Personal history of adenomatous and serrated colon polyps: Secondary | ICD-10-CM | POA: Insufficient documentation

## 2018-02-02 HISTORY — DX: Personal history of colonic polyps: Z86.010

## 2018-02-02 HISTORY — DX: Personal history of adenomatous and serrated colon polyps: Z86.0101

## 2018-04-25 ENCOUNTER — Ambulatory Visit: Payer: BLUE CROSS/BLUE SHIELD | Admitting: Adult Health

## 2018-04-25 ENCOUNTER — Telehealth: Payer: Self-pay | Admitting: *Deleted

## 2018-04-25 ENCOUNTER — Encounter: Payer: Self-pay | Admitting: Adult Health

## 2018-04-25 VITALS — BP 132/78 | Temp 98.0°F | Wt 115.0 lb

## 2018-04-25 DIAGNOSIS — L564 Polymorphous light eruption: Secondary | ICD-10-CM

## 2018-04-25 MED ORDER — TRIAMCINOLONE ACETONIDE 0.5 % EX OINT: 1.0000 "application " | TOPICAL_OINTMENT | Freq: Two times a day (BID) | CUTANEOUS | 1 refills | Status: AC

## 2018-04-25 NOTE — Telephone Encounter (Signed)
Patient walked in with a friend who served as Optometrist. Patient's friend reports she was out at the park on Sunday and her arms were exposed to the sun. Since then she reports itchy rash of bilateral arms. She has tried hydrocortisone 1% cream with no relief. She denies systemic rash, fever, nausea, and vomiting. Her friend feels she may need steroid shot.   Patient w/ slight diffuse redness over both arms extending from shoulders to wrist. Patient frequently scratching arms and a few excoriation marks noted bilaterally. Skin is otherwise intact.   No appointments available w/ PCP today and patient has another appointment at 3pm. Scheduled at [facility] [place] today w/ Dorothyann Peng.

## 2018-04-25 NOTE — Progress Notes (Signed)
Subjective:    Patient ID: Courtney Figueroa, female    DOB: 04-29-1966, 52 y.o.   MRN: [id number]  Reports that she would get the same rash as a child when she was outside. Went to [employer] over the weekend.   Rash  This is a new problem. The current episode started in the past 7 days (2 days ago ). The problem is unchanged. The affected locations include the right arm and left arm. The rash is characterized by redness and itchiness. Associated with: sun  Pertinent negatives include no cough, fatigue, fever, shortness of breath or vomiting. Past treatments include topical steroids. The treatment provided mild relief. There is no history of allergies, asthma, eczema or varicella.   Interpreter present     Review of Systems  Constitutional: Negative for fatigue and fever.  Respiratory: Negative for cough and shortness of breath.   Gastrointestinal: Negative for vomiting.  Skin: Positive for rash.   Past Medical History:  Diagnosis Date  . Blood transfusion without reported diagnosis 1995   after childbirth  . GERD (gastroesophageal reflux disease)   . Helicobacter pylori ab+ 2009   treated in [place]   . Hx of adenomatous polyp of colon 02/02/2018    Social History   Socioeconomic History  . Marital status: Married    Spouse name: Not on file  . Number of children: 2  . Years of education: Not on file  . Highest education level: Not on file  Occupational History  . Occupation: tailor    Employer: [employer]  . Financial resource strain: Not on file  . Food insecurity:    Worry: Not on file    Inability: Not on file  . Transportation needs:    Medical: Not on file    Non-medical: Not on file  Tobacco Use  . Smoking status: Never Smoker  . Smokeless tobacco: Never Used  Substance and Sexual Activity  . Alcohol use: No  . Drug use: No  . Sexual activity: Never  Lifestyle  . Physical activity:    Days per week: Not on file    Minutes per session: Not on  file  . Stress: Not on file  Relationships  . Social connections:    Talks on phone: Not on file    Gets together: Not on file    Attends religious service: Not on file    Active member of club or organization: Not on file    Attends meetings of clubs or organizations: Not on file    Relationship status: Not on file  . Intimate partner violence:    Fear of current or ex partner: Not on file    Emotionally abused: Not on file    Physically abused: Not on file    Forced sexual activity: Not on file  Other Topics Concern  . Not on file  Social History Narrative   Married, 1 son (72 yo)one daughter (44) in 2014   Graded from [place]   Works as a tailor    Caffeine   02/20/2013   No regular exercise; diet if fair                    Past Surgical History:  Procedure Laterality Date  . bartholin gland    . COLONOSCOPY  2009   [place] - ok  . ESOPHAGOGASTRODUODENOSCOPY  2009   [place] - ulcer scar  . OVARIAN CYST SURGERY Bilateral   . UPPER  GASTROINTESTINAL ENDOSCOPY      Family History  Problem Relation Age of Onset  . Colon cancer Neg Hx   . Esophageal cancer Neg Hx   . Rectal cancer Neg Hx   . Stomach cancer Neg Hx     No Known Allergies  Current Outpatient Medications on File Prior to Visit  Medication Sig Dispense Refill  . dicyclomine (BENTYL) 10 MG capsule Take 1 capsule (10 mg total) by mouth 4 (four) times daily as needed for spasms. Can take 2 pills if one does not help. 90 capsule 2  . esomeprazole (NEXIUM) 20 MG capsule Take 1 capsule (20 mg total) by mouth daily. 60 capsule 2   Current Facility-Administered Medications on File Prior to Visit  Medication Dose Route Frequency Provider Last Rate Last Dose  . 0.9 %  sodium chloride infusion  500 mL Intravenous Once Gatha Mayer, MD        BP 132/78   Temp 98 F (36.7 C) (Oral)   Wt 115 lb (52.2 kg)   LMP 02/09/2013   BMI 21.03 kg/m       Objective:   Physical Exam  Constitutional: She  appears well-developed and well-nourished. No distress.  Cardiovascular: Normal rate, regular rhythm, normal heart sounds and intact distal pulses.  Pulmonary/Chest: Effort normal and breath sounds normal.  Skin: Skin is warm and dry. Rash noted. Rash is papular. She is not diaphoretic. There is erythema.  Psychiatric: She has a normal mood and affect. Her behavior is normal. Judgment and thought content normal.  Nursing note and vitals reviewed.     Assessment & Plan:  1. Polymorphous light eruption - triamcinolone ointment (KENALOG) 0.5 %; Apply 1 application topically 2 (two) times daily.  Dispense: 30 g; Refill: 1 - avoid sun  - Follow up as needed  Dorothyann Peng, NP

## 2018-05-01 ENCOUNTER — Encounter: Payer: Self-pay | Admitting: *Deleted

## 2018-05-07 ENCOUNTER — Telehealth: Payer: Self-pay | Admitting: *Deleted

## 2018-05-07 NOTE — Congregational Nurse Program (Unsigned)
Congregational Nurse Program Note  Date of Encounter: 05/01/2018  Past Medical History: Past Medical History:  Diagnosis Date  . Blood transfusion without reported diagnosis 1995   after childbirth  . GERD (gastroesophageal reflux disease)   . Helicobacter pylori ab+ 2009   treated in [place]   . Hx of adenomatous polyp of colon 02/02/2018    Encounter Details: CNP Questionnaire - 05/01/18 0900      Questionnaire   Patient Status  Immigrant    Race  Asian    Location Patient Served At  Not Applicable home visit   home visit   [place]  No food insecurities    Housing/Utilities  No permanent housing    Transportation  No transportation needs    Interpersonal Safety  Yes, feel physically and emotionally safe where you currently live    Medication  No medication insecurities    Medical Provider  Yes    Referrals  Not Applicable    ED Visit Averted  Not Applicable    Life-Saving Intervention Made  Not Applicable       C/o severe HA and UTI sx. She visited urgent care center due to frequent UTI Sx.  MD rx antibiotic and Pyridium 200mg  PO prn,  Had GI problems and severe HA during the weekend.  Feel better now after stopped meds. Will f/p. Encouraged to drink water and cranberry juice.

## 2018-08-14 ENCOUNTER — Telehealth: Payer: Self-pay | Admitting: Neurology

## 2018-08-14 DIAGNOSIS — M542 Cervicalgia: Secondary | ICD-10-CM

## 2018-08-14 DIAGNOSIS — R200 Anesthesia of skin: Secondary | ICD-10-CM

## 2018-08-14 NOTE — Telephone Encounter (Signed)
Spoke with pt's daughter who states that pt was referred to PT but could not remember the facility.  Let daughter know that I never placed referral, due to not hearing back from pt in Oct 2018.  Daughter asked if referral can be placed again or if she will need to be seen by Dr. Delice Lesch first.  Advised that pt should be seen, since she hasn't since August 2018.  Will return call to daughter with appointment information.    Dr. Delice Lesch - where would you like me to place pt for f/u appointment?

## 2018-08-14 NOTE — Telephone Encounter (Signed)
LMOM for pt's daughter letting her know that I have placed referral and asked that she call office to schedule f/u appointment.

## 2018-08-14 NOTE — Telephone Encounter (Signed)
Referral placed.

## 2018-08-14 NOTE — Telephone Encounter (Signed)
If symptoms are the same as before, ok to proceed with PT for neck pain as previously planned, then f/u first available. Thanks

## 2018-08-14 NOTE — Telephone Encounter (Signed)
Patient daughter called and wants to know which therapy place we sent patient to

## 2018-08-15 ENCOUNTER — Encounter: Payer: Self-pay | Admitting: *Deleted

## 2018-08-24 ENCOUNTER — Encounter: Payer: Self-pay | Admitting: Physical Therapy

## 2018-08-24 ENCOUNTER — Other Ambulatory Visit: Payer: Self-pay

## 2018-08-24 ENCOUNTER — Ambulatory Visit: Payer: BLUE CROSS/BLUE SHIELD | Attending: Family Medicine | Admitting: Physical Therapy

## 2018-08-24 DIAGNOSIS — M542 Cervicalgia: Secondary | ICD-10-CM

## 2018-08-24 DIAGNOSIS — R293 Abnormal posture: Secondary | ICD-10-CM | POA: Insufficient documentation

## 2018-08-24 DIAGNOSIS — M62838 Other muscle spasm: Secondary | ICD-10-CM | POA: Diagnosis present

## 2018-08-24 NOTE — Patient Instructions (Addendum)
Trigger Point Dry Needling  . What is Trigger Point Dry Needling (DN)? o DN is a physical therapy technique used to treat muscle pain and dysfunction. Specifically, DN helps deactivate muscle trigger points (muscle knots).  o A thin filiform needle is used to penetrate the skin and stimulate the underlying trigger point. The goal is for a local twitch response (LTR) to occur and for the trigger point to relax. No medication of any kind is injected during the procedure.   . What Does Trigger Point Dry Needling Feel Like?  o The procedure feels different for each individual patient. Some patients report that they do not actually feel the needle enter the skin and overall the process is not painful. Very mild bleeding may occur. However, many patients feel a deep cramping in the muscle in which the needle was inserted. This is the local twitch response.   Marland Kitchen How Will I feel after the treatment? o Soreness is normal, and the onset of soreness may not occur for a few hours. Typically this soreness does not last longer than two days.  o Bruising is uncommon, however; ice can be used to decrease any possible bruising.  o In rare cases feeling tired or nauseous after the treatment is normal. In addition, your symptoms may get worse before they get better, this period will typically not last longer than 24 hours.   . What Can I do After My Treatment? o Increase your hydration by drinking more water for the next 24 hours. o You may place ice or heat on the areas treated that have become sore, however, do not use heat on inflamed or bruised areas. Heat often brings more relief post needling. o You can continue your regular activities, but vigorous activity is not recommended initially after the treatment for 24 hours. o DN is best combined with other physical therapy such as strengthening, stretching, and other therapies.   Access Code: [facility]  URL: [email or web address]  Date: 08/24/2018   Prepared by: Sherol Dade   Exercises  Seated Shoulder Rolls - 10 reps - 1x daily - 7x weekly  Doorway Pec Stretch at 90 Degrees Abduction - 2 reps - 20 hold - 1x daily - 7x weekly  Seated Upper Trapezius Stretch - 3 reps - 30 hold - 1x daily - 7x weekly    [facility] [address], [place], [place] [address] Phone # [phone] Fax [phone]

## 2018-08-24 NOTE — Therapy (Signed)
[facility] [address], [place], [place], [address] Phone: [phone]   Fax:  [phone]  Physical Therapy Evaluation  Patient Details  Name: Courtney Figueroa MRN: [id number] Date of Birth: 1966/09/05 Referring Provider (PT): Ellouise Newer, MD   Encounter Date: 08/24/2018  PT End of Session - 08/24/18 1255    Visit Number  1    Date for PT Re-Evaluation  10/06/18    Authorization Type  BCBS    Authorization Time Period  08/24/18 to 10/06/17    PT Start Time  1016    PT Stop Time  1058    PT Time Calculation (min)  42 min    Activity Tolerance  No increased pain;Patient tolerated treatment well    Behavior During Therapy  [facility] for tasks assessed/performed       Past Medical History:  Diagnosis Date  . Blood transfusion without reported diagnosis 1995   after childbirth  . GERD (gastroesophageal reflux disease)   . Helicobacter pylori ab+ 2009   treated in [place]   . Hx of adenomatous polyp of colon 02/02/2018    Past Surgical History:  Procedure Laterality Date  . bartholin gland    . COLONOSCOPY  2009   [place] - ok  . ESOPHAGOGASTRODUODENOSCOPY  2009   [place] - ulcer scar  . OVARIAN CYST SURGERY Bilateral   . UPPER GASTROINTESTINAL ENDOSCOPY      There were no vitals filed for this visit.   Subjective Assessment - 08/24/18 1019    Subjective  Pt's daughter reports that her mother's pain started about 5-6 years ago but went away. She would only have intermittent pains, but last year it got worse. She had neck pain and numbness and was encouraged to come to PT a year ago. She was very busy and although her numbness is gone, she will have pain intermittently. She now has time to go to PT. Pain is worse when sleeping in a bad position and is also noted on and off throughout the day.     Patient is accompained by:  Interpreter;Family member   Pt's daughter serving as interpreter   Pertinent History  n/a     Patient  Stated Goals  decrease pain with activity     Currently in Pain?  Yes   just uncomfortable on the Lt side base of the neck.    Pain Location  --   Lt occiput down to collar bone and down to top of scapula    Pain Orientation  Left    Pain Descriptors / Indicators  Aching;Sore;Tightness    Pain Type  Chronic pain    Pain Radiating Towards  none     Pain Onset  More than a month ago    Pain Frequency  Intermittent         [facility] PT Assessment - 08/24/18 0001      Assessment   Medical Diagnosis  cervicalgia    Referring Provider (PT)  Ellouise Newer, MD    Onset Date/Surgical Date  --   1 year ago   Hand Dominance  Right    Prior Therapy  none       Precautions   Precautions  None      Balance Screen   Has the patient fallen in the past 6 months  No    Has the patient had a decrease in activity level because of a fear of falling?   No    Is  the patient reluctant to leave their home because of a fear of falling?   No      Prior Function   Vocation Requirements  full time seamstress       Cognition   Overall Cognitive Status  Within Functional Limits for tasks assessed      ROM / Strength   AROM / PROM / Strength  AROM;Strength      AROM   AROM Assessment Site  Cervical    Cervical Flexion  40    Cervical Extension  50    Cervical - Right Side Bend  50    Cervical - Left Side Bend  50    Cervical - Right Rotation  70    Cervical - Left Rotation  70      Strength   Overall Strength Comments  shoulder flexion/abduction 4/5 MMT; bicep/tricep 5/5 MMT       Flexibility   Soft Tissue Assessment /Muscle Length  yes      Palpation   Palpation comment  muscle spasm and tenderness along Lt upper trap                Objective measurements completed on examination: See above findings.      [facility] Adult PT Treatment/Exercise - 08/24/18 0001      Manual Therapy   Manual Therapy  Soft tissue mobilization    Manual therapy comments  STM Lt upper trap/rhomboids         Trigger Point Dry Needling - 08/24/18 1038    Consent Given?  Yes    Education Handout Provided  Yes    Muscles Treated Upper Body  Upper trapezius   Lt   Upper Trapezius Response  Twitch reponse elicited;Palpable increased muscle length           PT Education - 08/24/18 1255    Education Details  dry needling info; eval findings/POC; implemented HEP    Person(s) Educated  Patient;Child(ren)    Methods  Explanation;Verbal cues;Handout    Comprehension  Verbalized understanding;Returned demonstration       PT Short Term Goals - 08/24/18 1404      PT SHORT TERM GOAL #1   Title  Pt will demo consistency and independence with her initial HEP to improve pain and posture awareness.    Time  2    Period  Weeks    Status  New    Target Date  09/07/18        PT Long Term Goals - 08/24/18 1407      PT LONG TERM GOAL #1   Title  Pt will demo pain free cervical active ROM which will reflect improvements in muscle spasm and trigger points.     Time  6    Period  Weeks    Status  New    Target Date  10/06/18      PT LONG TERM GOAL #2   Title  Pt will report atleast 75% improvement in her Lt side neck pain from the start of PT.     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     Time  6    Period  Weeks    Status  New  PT LONG TERM GOAL #4   Title  Pt will be independent with her advanced HEP to allow for maintenance of progress moving forward.    Time  6    Period  Weeks    Status  New             Plan - 08/24/18 1257    Clinical Impression Statement  Pt is a pleasant 52y.o F referred to OPPT with complaints of facial numbness and neck pain approximately one year ago. She was unable to attend PT at the time due to her busy schedule and presents today with her daughter serving as her interpreter. Her  pain and symptoms are greatly improved, she denies numbness currently. Pt's UE strength is grossly 4/5 MMT. She does have forward head posture with discomfort during cervical lateral flexion to the Rt and during active extension. Pt has palpable tenderness and muscle spasm in the Lt upper trap primarily, and her job places her in a flexed position making this more of an issue throughout the day. Risks and benefits were reviewed with the pt and she was agreeable to dry needling this session. Ended session with reports of pain free active ROM. Pt would benefit from skilled PT to address her posture, improve mechanics with daily activity, improve cervical strength/ROM and decrease pain to allow her to resume work without limitation.     History and Personal Factors relevant to plan of care:  seamstress full time    Clinical Presentation  Stable    Clinical Presentation due to:  improved over time     Clinical Decision Making  Low    Rehab Potential  Good    PT Frequency  1x / week    PT Duration  6 weeks    PT Treatment/Interventions  ADLs/Self Care Home Management;Moist Heat;Electrical Stimulation;Therapeutic activities;Therapeutic exercise;Manual techniques;Patient/family education;Dry needling;Taping;Spinal Manipulations;Joint Manipulations    PT Next Visit Plan  f/u on HEP and breaks during work; f/u on dry needling response; thoracic mobility; cervical strength and isometrics    PT Home Exercise Plan  Access Code: [facility]     Consulted and Agree with Plan of Care  Family member/caregiver;Patient    Family Member Consulted  pt's daughter served as interpreter       Patient will benefit from skilled therapeutic intervention in order to improve the following deficits and impairments:  Decreased activity tolerance, Decreased strength, Impaired flexibility, Postural dysfunction, Pain, Improper body mechanics, Decreased range of motion, Increased muscle spasms  Visit Diagnosis: Cervicalgia  Other  muscle spasm  Abnormal posture     Problem List Patient Active Problem List   Diagnosis Date Noted  . Hx of adenomatous polyp of colon 02/02/2018  . GERD (gastroesophageal reflux disease) 06/29/2013    2:52 PM,08/24/18 Sherol Dade PT, DPT [facility] [address] [place], [place], [address] Phone: [phone]   Fax:  [phone]  Name: Courtney Figueroa MRN: [id number] Date of Birth: 05/16/1966

## 2018-08-31 ENCOUNTER — Ambulatory Visit: Payer: BLUE CROSS/BLUE SHIELD | Admitting: Physical Therapy

## 2018-08-31 ENCOUNTER — Encounter: Payer: Self-pay | Admitting: Physical Therapy

## 2018-08-31 DIAGNOSIS — R293 Abnormal posture: Secondary | ICD-10-CM

## 2018-08-31 DIAGNOSIS — M62838 Other muscle spasm: Secondary | ICD-10-CM

## 2018-08-31 DIAGNOSIS — M542 Cervicalgia: Secondary | ICD-10-CM

## 2018-08-31 NOTE — Therapy (Signed)
[facility] [address], [place], [place], [address] Phone: [phone]   Fax:  [phone]  Physical Therapy Treatment  Patient Details  Name: Courtney Figueroa MRN: [id number] Date of Birth: Dec 15, 1965 Referring Provider (PT): Ellouise Newer, MD   Encounter Date: 08/31/2018  PT End of Session - 08/31/18 0827    Visit Number  2    Date for PT Re-Evaluation  10/06/18    Authorization Type  BCBS    Authorization Time Period  08/24/18 to 10/06/17    PT Start Time  0800    PT Stop Time  0840    PT Time Calculation (min)  40 min    Activity Tolerance  No increased pain;Patient tolerated treatment well    Behavior During Therapy  [facility] for tasks assessed/performed       Past Medical History:  Diagnosis Date  . Blood transfusion without reported diagnosis 1995   after childbirth  . GERD (gastroesophageal reflux disease)   . Helicobacter pylori ab+ 2009   treated in [place]   . Hx of adenomatous polyp of colon 02/02/2018    Past Surgical History:  Procedure Laterality Date  . bartholin gland    . COLONOSCOPY  2009   [place] - ok  . ESOPHAGOGASTRODUODENOSCOPY  2009   [place] - ulcer scar  . OVARIAN CYST SURGERY Bilateral   . UPPER GASTROINTESTINAL ENDOSCOPY      There were no vitals filed for this visit.  Subjective Assessment - 08/31/18 0805    Subjective  Pt reports that things are going well. She has had a big improvement in her pain following the dry needling. She had some pain and headache the same day, but since then she has had no pain.     Patient is accompained by:  Interpreter;Family member   Pt's daughter serving as interpreter   Pertinent History  n/a     Patient Stated Goals  decrease pain with activity     Currently in Pain?  No/denies    Pain Onset  More than a month ago                       [facility] Adult PT Treatment/Exercise - 08/31/18 0001      Exercises   Exercises  Neck      Neck  Exercises: Seated   Cervical Isometrics  Left lateral flexion;Right lateral flexion;Flexion;Right rotation;Left rotation;3 secs;10 reps    Other Seated Exercise  scap retraction x15 reps; scap depression x15 reps       Neck Exercises: Supine   Neck Retraction  10 reps;3 secs    Other Supine Exercise  BUE horizontal abduction with red TB x15 reps       Neck Exercises: Sidelying   Other Sidelying Exercise  thoracic rotation stretch x15 reps each       Manual Therapy   Manual therapy comments  STM Lt upper trap with trigger point release Lt upper trap              PT Education - 08/31/18 0826    Education Details  technique with therex; updated HEP    Person(s) Educated  Patient    Methods  Explanation;Verbal cues;Handout   via interpreter services   Comprehension  Verbalized understanding;Returned demonstration       PT Short Term Goals - 08/31/18 0931      PT SHORT TERM GOAL #1   Title  Pt will demo consistency  and independence with her initial HEP to improve pain and posture awareness.    Time  2    Period  Weeks    Status  Achieved        PT Long Term Goals - 08/24/18 1407      PT LONG TERM GOAL #1   Title  Pt will demo pain free cervical active ROM which will reflect improvements in muscle spasm and trigger points.     Time  6    Period  Weeks    Status  New    Target Date  10/06/18      PT LONG TERM GOAL #2   Title  Pt will report atleast 75% improvement in her Lt side neck pain from the start of PT.     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     Time  6    Period  Weeks    Status  New      PT LONG TERM GOAL #4   Title  Pt will be independent with her advanced HEP to allow for maintenance of progress moving forward.    Time  6    Period  Weeks    Status  New            Plan - 08/31/18  0846    Clinical Impression Statement  Pt arrived with resolved pain during her work week following dry needling and other treatment at her evaluation. Session focused on therex to improve cervical and scapular strength in order to increase her ability to maintain proper mechanics while working on her sewing projects. Pt did report pulling on the Lt clavicle during scap retraction but this was mostly secondary to her rounded shoulder posturing. Ended with soft tissue mobilization to the Lt upper trap and pt reported no increase in pain following this.    Rehab Potential  Good    PT Frequency  1x / week    PT Duration  6 weeks    PT Treatment/Interventions  ADLs/Self Care Home Management;Moist Heat;Electrical Stimulation;Therapeutic activities;Therapeutic exercise;Manual techniques;Patient/family education;Dry needling;Taping;Spinal Manipulations;Joint Manipulations    PT Next Visit Plan  possible d/n if pt interested again; thoracic mobility; cervical strength and isometrics    PT Home Exercise Plan  Access Code: [facility]     Consulted and Agree with Plan of Care  Family member/caregiver;Patient    Family Member Consulted  pt's daughter served as interpreter       Patient will benefit from skilled therapeutic intervention in order to improve the following deficits and impairments:  Decreased activity tolerance, Decreased strength, Impaired flexibility, Postural dysfunction, Pain, Improper body mechanics, Decreased range of motion, Increased muscle spasms  Visit Diagnosis: Cervicalgia  Other muscle spasm  Abnormal posture     Problem List Patient Active Problem List   Diagnosis Date Noted  . Hx of adenomatous polyp of colon 02/02/2018  . GERD (gastroesophageal reflux disease) 06/29/2013    9:45 AM,08/31/18 Sherol Dade PT, DPT [place] [facility] [address], [place], [place], [address] Phone: [phone]   Fax:  [phone]  Name: Courtney Figueroa MRN: [id number] Date of Birth: 18-Sep-1966

## 2018-08-31 NOTE — Patient Instructions (Signed)
Access Code: [facility]  URL: [email or web address]  Date: 08/31/2018  Prepared by: Sherol Dade   Exercises  Seated Shoulder Rolls - 10 reps - 1x daily - 7x weekly  Doorway Pec Stretch at 90 Degrees Abduction - 2 reps - 20 hold - 1x daily - 7x weekly  Seated Upper Trapezius Stretch - 3 reps - 30 hold - 1x daily - 7x weekly  Supine Shoulder Horizontal Abduction with Resistance - 10 reps - 1x daily - 7x weekly  Supine Chin Tuck - 10 reps - 3 sets - 5 hold - 1x daily - 7x weekly    [facility] [address], [place], [place] [address] Phone # [phone] Fax [phone]

## 2018-09-07 ENCOUNTER — Encounter: Payer: Self-pay | Admitting: Physical Therapy

## 2018-09-07 ENCOUNTER — Ambulatory Visit: Payer: BLUE CROSS/BLUE SHIELD | Admitting: Physical Therapy

## 2018-09-07 DIAGNOSIS — M62838 Other muscle spasm: Secondary | ICD-10-CM

## 2018-09-07 DIAGNOSIS — M542 Cervicalgia: Secondary | ICD-10-CM

## 2018-09-07 DIAGNOSIS — R293 Abnormal posture: Secondary | ICD-10-CM

## 2018-09-07 NOTE — Patient Instructions (Signed)
Access Code: [facility]  URL: [email or web address]  Date: 09/07/2018  Prepared by: Sherol Dade   Exercises  Seated Shoulder Rolls - 10 reps - 1x daily - 7x weekly  Doorway Pec Stretch at 90 Degrees Abduction - 2 reps - 20 hold - 1x daily - 7x weekly  Seated Upper Trapezius Stretch - 3 reps - 30 hold - 1x daily - 7x weekly  Seated Cervical Retraction Passive Loaded - 15 reps - 3 hold - 2x daily - 7x weekly  Standing Row with Anchored Resistance - 15 reps - 2x daily - 7x weekly  Shoulder extension with resistance - Neutral - 15 reps - 2x daily - 7x weekly    [facility] [address], [place], [place] [address] Phone # [phone] Fax [phone]

## 2018-09-07 NOTE — Therapy (Signed)
[facility] [address] [place], [place], [address] Phone: [phone]   Fax:  [phone]  Physical Therapy Treatment  Patient Details  Name: Courtney Figueroa MRN: [id number] Date of Birth: January 29, 1966 Referring Provider (PT): Ellouise Newer, MD   Encounter Date: 09/07/2018  PT End of Session - 09/07/18 0845    Visit Number  3    Date for PT Re-Evaluation  10/06/18    Authorization Type  BCBS    Authorization Time Period  08/24/18 to 10/06/17    PT Start Time  0801    PT Stop Time  0843    PT Time Calculation (min)  42 min    Activity Tolerance  No increased pain;Patient tolerated treatment well    Behavior During Therapy  [facility] for tasks assessed/performed       Past Medical History:  Diagnosis Date  . Blood transfusion without reported diagnosis 1995   after childbirth  . GERD (gastroesophageal reflux disease)   . Helicobacter pylori ab+ 2009   treated in [place]   . Hx of adenomatous polyp of colon 02/02/2018    Past Surgical History:  Procedure Laterality Date  . bartholin gland    . COLONOSCOPY  2009   [place] - ok  . ESOPHAGOGASTRODUODENOSCOPY  2009   [place] - ulcer scar  . OVARIAN CYST SURGERY Bilateral   . UPPER GASTROINTESTINAL ENDOSCOPY      There were no vitals filed for this visit.  Subjective Assessment - 09/07/18 0805    Subjective  Pt reports that things are going well. She continues to have no pain throughout the day and denies any numbness in the face. She just feels a slight pull in her neck with some of the exercises, but no other concerns.     Patient is accompained by:  Interpreter;Family member   Pt's daughter serving as interpreter   Pertinent History  n/a     Patient Stated Goals  decrease pain with activity     Currently in Pain?  No/denies    Pain Onset  More than a month ago                       [facility] Adult PT Treatment/Exercise - 09/07/18 0001      Exercises   Exercises  Neck      Neck Exercises: Seated   Shoulder Rolls  Backwards;20 reps    Other Seated Exercise  BUE rows with green TB x15 reps; BUE pressdown with red TB x15 reps       Neck Exercises: Supine   Upper Extremity D2  Flexion;15 reps    UE D2 Limitations  yellow TB, with neck retraction hold     Other Supine Exercise  BUE snow angles x15 reps over foam roll       Neck Exercises: Sidelying   Other Sidelying Exercise  thoracic rotation with yellow TB resistance x15 reps Lt and Rt       Manual Therapy   Manual Therapy  Joint mobilization;Myofascial release    Joint Mobilization  Lt first rib inferior mobilization grade III-IV x3 bouts     Myofascial Release  trigger point release and stretch to Lt upper trap             PT Education - 09/07/18 0845    Education Details  technique with therex; updated HEP    Person(s) Educated  Patient;Child(ren)    Methods  Explanation;Handout  via daughter interpreter   Comprehension  Verbalized understanding;Returned demonstration       PT Short Term Goals - 08/31/18 0931      PT SHORT TERM GOAL #1   Title  Pt will demo consistency and independence with her initial HEP to improve pain and posture awareness.    Time  2    Period  Weeks    Status  Achieved        PT Long Term Goals - 08/24/18 1407      PT LONG TERM GOAL #1   Title  Pt will demo pain free cervical active ROM which will reflect improvements in muscle spasm and trigger points.     Time  6    Period  Weeks    Status  New    Target Date  10/06/18      PT LONG TERM GOAL #2   Title  Pt will report atleast 75% improvement in her Lt side neck pain from the start of PT.     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     Time  6    Period  Weeks    Status  New      PT LONG TERM GOAL #4   Title  Pt will  be independent with her advanced HEP to allow for maintenance of progress moving forward.    Time  6    Period  Weeks    Status  New            Plan - 09/07/18 0846    Clinical Impression Statement  Pt continues to do well, reporting no increase in pain over the past week. She has been consistently completing her HEP and today's exercises were updated to address upper thoracic and scapular strength in more upright positions. Pt did require intermittent cuing with rows to decrease shoulder shrug, but she was able to pick up on proper form by the end of the exercise. Ended with manual treatment of soft tissue release to the Lt upper trap primarily and no increase in discomfort was noted end of session. Pt verbalized and demonstrated understanding of HEP updates made as well.     Rehab Potential  Good    PT Frequency  1x / week    PT Duration  6 weeks    PT Treatment/Interventions  ADLs/Self Care Home Management;Moist Heat;Electrical Stimulation;Therapeutic activities;Therapeutic exercise;Manual techniques;Patient/family education;Dry needling;Taping;Spinal Manipulations;Joint Manipulations    PT Next Visit Plan  seated and standing scap strength progressions; closed chain shoulder strength (pushups, walk outs, etc); thoracic mobility; cervical strength and isometrics    PT Home Exercise Plan  Access Code: [email or web address]     Consulted and Agree with Plan of Care  Family member/caregiver;Patient    Family Member Consulted  pt's daughter served as interpreter       Patient will benefit from skilled therapeutic intervention in order to improve the following deficits and impairments:  Decreased activity tolerance, Decreased strength, Impaired flexibility, Postural dysfunction, Pain, Improper body mechanics, Decreased range of motion, Increased muscle spasms  Visit Diagnosis: Cervicalgia  Other muscle spasm  Abnormal posture     Problem List Patient Active Problem List   Diagnosis Date Noted   . Hx of  adenomatous polyp of colon 02/02/2018  . GERD (gastroesophageal reflux disease) 06/29/2013     8:53 AM,09/07/18 Sherol Dade PT, DPT [place] [facility] [address], [place], [place], [address] Phone: [phone]   Fax:  [phone]  Name: Raela Bohl MRN: [id number] Date of Birth: Jun 23, 1966

## 2018-09-14 ENCOUNTER — Encounter: Payer: Self-pay | Admitting: Physical Therapy

## 2018-09-14 ENCOUNTER — Ambulatory Visit: Payer: BLUE CROSS/BLUE SHIELD | Attending: Family Medicine | Admitting: Physical Therapy

## 2018-09-14 DIAGNOSIS — M62838 Other muscle spasm: Secondary | ICD-10-CM

## 2018-09-14 DIAGNOSIS — M542 Cervicalgia: Secondary | ICD-10-CM | POA: Insufficient documentation

## 2018-09-14 DIAGNOSIS — R293 Abnormal posture: Secondary | ICD-10-CM | POA: Diagnosis present

## 2018-09-14 NOTE — Patient Instructions (Signed)
Access Code: [facility]  URL: [email or web address]  Date: 09/14/2018  Prepared by: Sherol Dade   Exercises  Seated Shoulder Rolls - 10 reps - 1x daily - 7x weekly  Doorway Pec Stretch at 90 Degrees Abduction - 2 reps - 20 hold - 1x daily - 7x weekly  Seated Upper Trapezius Stretch - 3 reps - 30 hold - 1x daily - 7x weekly  Seated Cervical Retraction Passive Loaded - 15 reps - 3 hold - 2x daily - 7x weekly  Standing Row with Anchored Resistance - 15 reps - 2x daily - 7x weekly  Shoulder extension with resistance - Neutral - 15 reps - 2x daily - 7x weekly  Prone Chest Stretch on Chair - 15 reps - 1x daily - 7x weekly  Seated Shoulder Diagonal Pulls with Resistance - 15 reps - 1 sets - 1x daily - 7x weekly    [facility] [address] [place], [place] [address] Phone # [phone] Fax [phone]

## 2018-09-14 NOTE — Therapy (Signed)
[facility] [address] [place], [place], [address] Phone: [phone]   Fax:  [phone]  Physical Therapy Treatment/Discharge   Patient Details  Name: Courtney Figueroa MRN: [id number] Date of Birth: [birth year] Referring Provider (PT): Ellouise Newer, MD   Encounter Date: 09/14/2018  PT End of Session - 09/14/18 0843    Visit Number  4    Date for PT Re-Evaluation  10/06/18    Authorization Type  BC[employer]    Authorization Time Period  08/24/18 to 10/06/17    PT Start Time  0800    PT Stop Time  0842    PT Time Calculation (min)  42 min    Activity Tolerance  No increased pain;Patient tolerated treatment well    Behavior During Therapy  [facility] for tasks assessed/performed       Past Medical History:  Diagnosis Date  . Blood transfusion without reported diagnosis 1995   after childbirth  . GERD (gastroesophageal reflux disease)   . Helicobacter pylori ab+ 2009   treated in [place]   . Hx of adenomatous polyp of colon 02/02/2018    Past Surgical History:  Procedure Laterality Date  . bartholin gland    . COLONOSCOPY  2009   [place] - ok  . ESOPHAGOGASTRODUODENOSCOPY  2009   [place] - ulcer scar  . OVARIAN CYST SURGERY Bilateral   . UPPER GASTROINTESTINAL ENDOSCOPY      There were no vitals filed for this visit.  Subjective Assessment - 09/14/18 0804    Subjective  Pt reports that things continue to go well. She is completing her HEP and is having no issues with this. She has no pain currently.     Patient is accompained by:  Interpreter;Family member   Pt's daughter serving as interpreter   Pertinent History  n/a     Patient Stated Goals  decrease pain with activity     Currently in Pain?  No/denies    Pain Onset  More than a month ago         [facility] PT Assessment - 09/14/18 0001      Assessment   Medical Diagnosis  cervicalgia    Referring Provider (PT)  Ellouise Newer, MD    Onset Date/Surgical Date  --   1 year  ago   Hand Dominance  Right    Prior Therapy  none       Precautions   Precautions  None      Prior Function   Vocation Requirements  full time seamstress       Cognition   Overall Cognitive Status  Within Functional Limits for tasks assessed      AROM   Cervical Flexion  40    Cervical Extension  50    Cervical - Right Side Bend  50    Cervical - Left Side Bend  50    Cervical - Right Rotation  70    Cervical - Left Rotation  70      Strength   Overall Strength Comments  shoulder flexion/abduction /5 MMT; bicep/tricep 5/5 MMT       Flexibility   Soft Tissue Assessment /Muscle Length  yes      Palpation   Palpation comment  non-tender along cervical musculature                   [facility] Adult PT Treatment/Exercise - 09/14/18 0001      Exercises   Exercises  Neck  Neck Exercises: Machines for Strengthening   UBE (Upper Arm Bike)  L1 x2 min forward/backward PT present to discuss progress and HEP      Neck Exercises: Standing   Other Standing Exercises  BUE rows with blue TB x15 reps     Other Standing Exercises  BUE pressdown with green TB x15 reps       Neck Exercises: Seated   Cervical Isometrics  Flexion;Extension;Right lateral flexion;Left lateral flexion;Right rotation;Left rotation;5 reps   x15 sec    Upper Extremity D2  Flexion;15 reps    UE D2 Limitations  yellow TB, Lt and Rt with verbal cues to decrease shoulder shrug     Other Seated Exercise  B horizontal abduction with yellow TB x15 reps      Neck Exercises: Stretches   Other Neck Stretches  lat stretch seated over foam roll              PT Education - 09/14/18 0843    Education Details  updated HEP    Person(s) Educated  Patient;Child(ren)    Methods  Explanation;Demonstration;Handout    Comprehension  Verbalized understanding;Returned demonstration       PT Short Term Goals - 09/14/18 0848      PT SHORT TERM GOAL #1   Title  Pt will demo consistency and independence  with her initial HEP to improve pain and posture awareness.    Time  2    Period  Weeks    Status  Achieved        PT Long Term Goals - 09/14/18 2778      PT LONG TERM GOAL #1   Title  Pt will demo pain free cervical active ROM which will reflect improvements in muscle spasm and trigger points.     Baseline  pain free each direction    Time  6    Period  Weeks    Status  Achieved      PT LONG TERM GOAL #2   Title  Pt will report atleast 75% improvement in her Lt side neck pain from the start of PT.     Baseline  100%    Time  6    Period  Weeks    Status  Achieved      PT LONG TERM GOAL #3   Title  Pt will be able to complete her sewing projects with intermittent rest breaks and without increase in Lt side neck pain.     Time  6    Period  Weeks    Status  Achieved      PT LONG TERM GOAL #4   Title  Pt will be independent with her advanced HEP to allow for maintenance of progress moving forward.    Time  6    Period  Weeks    Status  Achieved            Plan - 09/14/18 0844    Clinical Impression Statement  Pt was discharged this visit having met all of her short and long term goals since beginning PT. She responded well to dry needling and therex treatment and has had no reported pain for atleast 2 weeks. Shoulder strength is within normal limits/pain free and her cervical active ROM is full and pain free. She has been completing her HEP regularly and demonstrated good understanding of HEP updates made this visit. She is agreeable with d/c at this time, being pleased with her progress and feeling comfortable with  HEP adherence moving forward.     Rehab Potential  Good    PT Frequency  1x / week    PT Duration  6 weeks    PT Treatment/Interventions  ADLs/Self Care Home Management;Moist Heat;Electrical Stimulation;Therapeutic activities;Therapeutic exercise;Manual techniques;Patient/family education;Dry needling;Taping;Spinal Manipulations;Joint Manipulations    PT  Next Visit Plan  d/c with HEP    PT Home Exercise Plan  Access Code: [facility]     Consulted and Agree with Plan of Care  Family member/caregiver;Patient    Family Member Consulted  pt's daughter served as interpreter       Patient will benefit from skilled therapeutic intervention in order to improve the following deficits and impairments:  Decreased activity tolerance, Decreased strength, Impaired flexibility, Postural dysfunction, Pain, Improper body mechanics, Decreased range of motion, Increased muscle spasms  Visit Diagnosis: Cervicalgia  Other muscle spasm  Abnormal posture     Problem List Patient Active Problem List   Diagnosis Date Noted  . Hx of adenomatous polyp of colon 02/02/2018  . GERD (gastroesophageal reflux disease) 06/29/2013   PHYSICAL THERAPY DISCHARGE SUMMARY  Visits from Start of Care: 4  Current functional level related to goals / functional outcomes: See above for more details    Remaining deficits: See above for more details    Education / Equipment: See above for more details   Plan: Patient agrees to discharge.  Patient goals were met. Patient is being discharged due to being pleased with the current functional level.  ?????         8:50 AM,09/14/18 Sherol Dade PT, [place] [facility] [place]  [facility] [address], [place], [place], [address] Phone: [phone]   Fax:  [phone]  Name: Courtney Figueroa MRN: [id number] Date of Birth: 08/03/1966

## 2018-09-19 ENCOUNTER — Encounter: Payer: BLUE CROSS/BLUE SHIELD | Admitting: Physical Therapy

## 2018-09-28 ENCOUNTER — Encounter: Payer: BLUE CROSS/BLUE SHIELD | Admitting: Physical Therapy

## 2018-10-03 ENCOUNTER — Encounter: Payer: BLUE CROSS/BLUE SHIELD | Admitting: Physical Therapy

## 2018-10-26 ENCOUNTER — Ambulatory Visit (INDEPENDENT_AMBULATORY_CARE_PROVIDER_SITE_OTHER): Payer: BLUE CROSS/BLUE SHIELD | Admitting: Family Medicine

## 2018-10-26 ENCOUNTER — Encounter: Payer: Self-pay | Admitting: Family Medicine

## 2018-10-26 VITALS — BP 100/78 | HR 72 | Temp 97.7°F | Ht 61.25 in | Wt 123.8 lb

## 2018-10-26 DIAGNOSIS — Z0000 Encounter for general adult medical examination without abnormal findings: Secondary | ICD-10-CM

## 2018-10-26 DIAGNOSIS — E785 Hyperlipidemia, unspecified: Secondary | ICD-10-CM

## 2018-10-26 LAB — LIPID PANEL
CHOL/HDL RATIO: 3
Cholesterol: 195 mg/dL (ref 0–200)
HDL: 56.7 mg/dL (ref >39.00)
LDL Cholesterol: 111 mg/dL — ABNORMAL HIGH (ref 0–99)
NonHDL: 138.14
TRIGLYCERIDES: 135 mg/dL (ref 0.0–149.0)
VLDL: 27 mg/dL (ref 0.0–40.0)

## 2018-10-26 LAB — HEMOGLOBIN A1C: Hgb A1c MFr Bld: 5.4 % (ref 4.6–6.5)

## 2018-10-26 NOTE — Progress Notes (Signed)
HPI:  Using dictation device. Unfortunately this device frequently misinterprets words/phrases.  Here for CPE:  -Concerns and/or follow up today:   Reports doing well. Did PT for neck and feeling better. Occ mild HA with indigestion that occurs rarely and resolves with in 1/2 day. She does not feel needs to be addressed as is mild and rare. Otherwise neg ROS.  -Diet: variety of foods, balance and well rounded, larger portion sizes -Exercise: no regular exercise -Taking folic acid, vitamin D or calcium: no -Diabetes and Dyslipidemia Screening: fasting -Vaccines: see vaccine section EPIC -pap history: done, 2017 -FDLMP: see nursing notes -sexual activity: not discussed -wants STI testing (Hep C if born [birth year]): no -FH breast, colon or ovarian ca: see FH Last mammogram: done 10/2016 Last colon cancer screening: did colonoscopy 01/2018 Breast Ca Risk Assessment: see family history and pt history DEXA (>/= 65): n/a  -Alcohol, Tobacco, drug use: see social history  Review of Systems - no fevers, unintentional weight loss, vision loss, hearing loss, chest pain, sob, hemoptysis, melena, hematochezia, hematuria, genital discharge, changing or concerning skin lesions, bleeding, bruising, loc, thoughts of self harm or SI  Past Medical History:  Diagnosis Date  . Blood transfusion without reported diagnosis 1995   after childbirth  . GERD (gastroesophageal reflux disease)   . Helicobacter pylori ab+ 2009   treated in [place]   . Hx of adenomatous polyp of colon 02/02/2018    Past Surgical History:  Procedure Laterality Date  . bartholin gland    . COLONOSCOPY  2009   [place] - ok  . ESOPHAGOGASTRODUODENOSCOPY  2009   [place] - ulcer scar  . OVARIAN CYST SURGERY Bilateral   . UPPER GASTROINTESTINAL ENDOSCOPY      Family History  Problem Relation Age of Onset  . Colon cancer Neg Hx   . Esophageal cancer Neg Hx   . Rectal cancer Neg Hx   . Stomach cancer Neg Hx     Social  History   Socioeconomic History  . Marital status: Married    Spouse name: Not on file  . Number of children: 2  . Years of education: Not on file  . Highest education level: Not on file  Occupational History  . Occupation: tailor    Employer: [employer]  . Financial resource strain: Not on file  . Food insecurity:    Worry: Not on file    Inability: Not on file  . Transportation needs:    Medical: Not on file    Non-medical: Not on file  Tobacco Use  . Smoking status: Never Smoker  . Smokeless tobacco: Never Used  Substance and Sexual Activity  . Alcohol use: No  . Drug use: No  . Sexual activity: Never  Lifestyle  . Physical activity:    Days per week: Not on file    Minutes per session: Not on file  . Stress: Not on file  Relationships  . Social connections:    Talks on phone: Not on file    Gets together: Not on file    Attends religious service: Not on file    Active member of club or organization: Not on file    Attends meetings of clubs or organizations: Not on file    Relationship status: Not on file  Other Topics Concern  . Not on file  Social History Narrative   Married, 1 son (16 yo)one daughter (21) in 2014   Graded from [place]   Works  as a tailor    Caffeine   02/20/2013   No regular exercise; diet if fair                     Current Outpatient Medications:  .  dicyclomine (BENTYL) 10 MG capsule, Take 1 capsule (10 mg total) by mouth 4 (four) times daily as needed for spasms. Can take 2 pills if one does not help., Disp: 90 capsule, Rfl: 2 .  esomeprazole (NEXIUM) 20 MG capsule, Take 1 capsule (20 mg total) by mouth daily., Disp: 60 capsule, Rfl: 2 .  triamcinolone ointment (KENALOG) 0.5 %, Apply 1 application topically 2 (two) times daily., Disp: 30 g, Rfl: 1  Current Facility-Administered Medications:  .  0.9 %  sodium chloride infusion, 500 mL, Intravenous, Once, Gatha Mayer, MD  EXAM:  Vitals:   10/26/18 0828   BP: 100/78  Pulse: 72  Temp: 97.7 F (36.5 C)    GENERAL: vitals reviewed and listed below, alert, oriented, appears well hydrated and in no acute distress  HEENT: head atraumatic, PERRLA, normal appearance of eyes, ears, nose and mouth. moist mucus membranes.  NECK: supple, no masses or lymphadenopathy  LUNGS: clear to auscultation bilaterally, no rales, rhonchi or wheeze  CV: HRRR, no peripheral edema or cyanosis, normal pedal pulses  ABDOMEN: bowel sounds normal, soft, non tender to palpation, no masses, no rebound or guarding  BREAST: normal appearance - no skin lesions or discharge noted on inspection of both breasts, on palpation of both breast and axillary region no suspicious lesions appreciated today  GU: declined  RECTAL: deferred  SKIN: no rash or abnormal lesions  MS: normal gait, moves all extremities normally  NEURO: normal gait, speech and thought processing grossly intact, muscle tone grossly intact throughout, CN II-XII grossly intact  PSYCH: normal affect, pleasant and cooperative  ASSESSMENT AND PLAN:  Discussed the following assessment and plan:  PREVENTIVE EXAM: -Discussed and advised all [place] preventive services health task force level A and B recommendations for age, sex and risks. -Advised at least 150 minutes of exercise per week and a healthy diet with avoidance of (less then 1 serving per week) processed foods, white starches, red meat, fast foods and sweets and consisting of: * 5-9 servings of fresh fruits and vegetables (not corn or potatoes) *nuts and seeds, beans *olives and olive oil *lean meats such as fish and white chicken  *whole grains -reports scheduled for mammo -offered evaluation of the rare mild ha, query mild migraine - she declined as is rare, agrees to keep journal and bring to next appt -labs, studies and vaccines per orders this encounter    Patient advised to return to clinic immediately if symptoms worsen or persist or  new concerns.  Patient Instructions  BEFORE YOU LEAVE: -phq9 in epic -labs -follow up: 3 months  Get mammogram  We have ordered labs or studies at this visit. It can take up to 1-2 weeks for results and processing. IF results require follow up or explanation, we will call you with instructions. Clinically stable results will be released to your [facility]. If you have not heard from [place] or cannot find your results in [facility] in 2 weeks please contact our office at [phone].  If you are not yet signed up for [facility], please consider signing up.   Preventive Care 40-64 Years, Female Preventive care refers to lifestyle choices and visits with your health care provider that can promote health and wellness. What does  preventive care include?   A yearly physical exam. This is also called an annual well check.  Dental exams once or twice a year.  Routine eye exams. Ask your health care provider how often you should have your eyes checked.  Personal lifestyle choices, including: ? Daily care of your teeth and gums. ? Regular physical activity. ? Eating a healthy diet. ? Avoiding tobacco and drug use. ? Limiting alcohol use. ? Practicing safe sex. ? Taking low-dose aspirin daily starting at age 28. ? Taking vitamin and mineral supplements as recommended by your health care provider. What happens during an annual well check? The services and screenings done by your health care provider during your annual well check will depend on your age, overall health, lifestyle risk factors, and family history of disease. Counseling Your health care provider may ask you questions about your:  Alcohol use.  Tobacco use.  Drug use.  Emotional well-being.  Home and relationship well-being.  Sexual activity.  Eating habits.  Work and work Statistician.  Method of birth control.  Menstrual cycle.  Pregnancy history. Screening You may have the following tests or  measurements:  Height, weight, and BMI.  Blood pressure.  Lipid and cholesterol levels. These may be checked every 5 years, or more frequently if you are over 86 years old.  Skin check.  Lung cancer screening. You may have this screening every year starting at age 27 if you have a 30-pack-year history of smoking and currently smoke or have quit within the past 15 years.  Colorectal cancer screening. All adults should have this screening starting at age 80 and continuing until age 51. Your health care provider may recommend screening at age 36. You will have tests every 1-10 years, depending on your results and the type of screening test. People at increased risk should start screening at an earlier age. Screening tests may include: ? Guaiac-based fecal occult blood testing. ? Fecal immunochemical test (FIT). ? Stool DNA test. ? Virtual colonoscopy. ? Sigmoidoscopy. During this test, a flexible tube with a tiny camera (sigmoidoscope) is used to examine your rectum and lower colon. The sigmoidoscope is inserted through your anus into your rectum and lower colon. ? Colonoscopy. During this test, a long, thin, flexible tube with a tiny camera (colonoscope) is used to examine your entire colon and rectum.  Hepatitis C blood test.  Hepatitis B blood test.  Sexually transmitted disease (STD) testing.  Diabetes screening. This is done by checking your blood sugar (glucose) after you have not eaten for a while (fasting). You may have this done every 1-3 years.  Mammogram. This may be done every 1-2 years. Talk to your health care provider about when you should start having regular mammograms. This may depend on whether you have a family history of breast cancer.  BRCA-related cancer screening. This may be done if you have a family history of breast, ovarian, tubal, or peritoneal cancers.  Pelvic exam and Pap test. This may be done every 3 years starting at age 19. Starting at age 62, this may  be done every 5 years if you have a Pap test in combination with an HPV test.  Bone density scan. This is done to screen for osteoporosis. You may have this scan if you are at high risk for osteoporosis. Discuss your test results, treatment options, and if necessary, the need for more tests with your health care provider. Vaccines Your health care provider may recommend certain vaccines, such as:  Influenza vaccine. This is recommended every year.  Tetanus, diphtheria, and acellular pertussis (Tdap, Td) vaccine. You may need a Td booster every 10 years.  Varicella vaccine. You may need this if you have not been vaccinated.  Zoster vaccine. You may need this after age 65.  Measles, mumps, and rubella (MMR) vaccine. You may need at least one dose of MMR if you were born in 1957 or later. You may also need a second dose.  Pneumococcal 13-valent conjugate (PCV13) vaccine. You may need this if you have certain conditions and were not previously vaccinated.  Pneumococcal polysaccharide (PPSV23) vaccine. You may need one or two doses if you smoke cigarettes or if you have certain conditions.  Meningococcal vaccine. You may need this if you have certain conditions.  Hepatitis A vaccine. You may need this if you have certain conditions or if you travel or work in places where you may be exposed to hepatitis A.  Hepatitis B vaccine. You may need this if you have certain conditions or if you travel or work in places where you may be exposed to hepatitis B.  Haemophilus influenzae type b (Hib) vaccine. You may need this if you have certain conditions. Talk to your health care provider about which screenings and vaccines you need and how often you need them. This information is not intended to replace advice given to you by your health care provider. Make sure you discuss any questions you have with your health care provider. Document Released: 11/21/2015 Document Revised: 12/15/2017 Document  Reviewed: 08/26/2015 Elsevier Interactive Patient Education  2019 [employer].          No follow-ups on file.  Lucretia Kern, DO

## 2018-10-26 NOTE — Patient Instructions (Signed)
BEFORE YOU LEAVE: -phq9 in epic -labs -follow up: 3 months  Get mammogram  We have ordered labs or studies at this visit. It can take up to 1-2 weeks for results and processing. IF results require follow up or explanation, we will call you with instructions. Clinically stable results will be released to your [facility]. If you have not heard from [place] or cannot find your results in [facility] in 2 weeks please contact our office at [phone].  If you are not yet signed up for [facility], please consider signing up.   Preventive Care 40-64 Years, Female Preventive care refers to lifestyle choices and visits with your health care provider that can promote health and wellness. What does preventive care include?   A yearly physical exam. This is also called an annual well check.  Dental exams once or twice a year.  Routine eye exams. Ask your health care provider how often you should have your eyes checked.  Personal lifestyle choices, including: ? Daily care of your teeth and gums. ? Regular physical activity. ? Eating a healthy diet. ? Avoiding tobacco and drug use. ? Limiting alcohol use. ? Practicing safe sex. ? Taking low-dose aspirin daily starting at age 76. ? Taking vitamin and mineral supplements as recommended by your health care provider. What happens during an annual well check? The services and screenings done by your health care provider during your annual well check will depend on your age, overall health, lifestyle risk factors, and family history of disease. Counseling Your health care provider may ask you questions about your:  Alcohol use.  Tobacco use.  Drug use.  Emotional well-being.  Home and relationship well-being.  Sexual activity.  Eating habits.  Work and work Statistician.  Method of birth control.  Menstrual cycle.  Pregnancy history. Screening You may have the following tests or measurements:  Height, weight, and BMI.  Blood  pressure.  Lipid and cholesterol levels. These may be checked every 5 years, or more frequently if you are over 5 years old.  Skin check.  Lung cancer screening. You may have this screening every year starting at age 34 if you have a 30-pack-year history of smoking and currently smoke or have quit within the past 15 years.  Colorectal cancer screening. All adults should have this screening starting at age 33 and continuing until age 72. Your health care provider may recommend screening at age 69. You will have tests every 1-10 years, depending on your results and the type of screening test. People at increased risk should start screening at an earlier age. Screening tests may include: ? Guaiac-based fecal occult blood testing. ? Fecal immunochemical test (FIT). ? Stool DNA test. ? Virtual colonoscopy. ? Sigmoidoscopy. During this test, a flexible tube with a tiny camera (sigmoidoscope) is used to examine your rectum and lower colon. The sigmoidoscope is inserted through your anus into your rectum and lower colon. ? Colonoscopy. During this test, a long, thin, flexible tube with a tiny camera (colonoscope) is used to examine your entire colon and rectum.  Hepatitis C blood test.  Hepatitis B blood test.  Sexually transmitted disease (STD) testing.  Diabetes screening. This is done by checking your blood sugar (glucose) after you have not eaten for a while (fasting). You may have this done every 1-3 years.  Mammogram. This may be done every 1-2 years. Talk to your health care provider about when you should start having regular mammograms. This may depend on whether you have a  family history of breast cancer.  BRCA-related cancer screening. This may be done if you have a family history of breast, ovarian, tubal, or peritoneal cancers.  Pelvic exam and Pap test. This may be done every 3 years starting at age 56. Starting at age 12, this may be done every 5 years if you have a Pap test in  combination with an HPV test.  Bone density scan. This is done to screen for osteoporosis. You may have this scan if you are at high risk for osteoporosis. Discuss your test results, treatment options, and if necessary, the need for more tests with your health care provider. Vaccines Your health care provider may recommend certain vaccines, such as:  Influenza vaccine. This is recommended every year.  Tetanus, diphtheria, and acellular pertussis (Tdap, Td) vaccine. You may need a Td booster every 10 years.  Varicella vaccine. You may need this if you have not been vaccinated.  Zoster vaccine. You may need this after age 68.  Measles, mumps, and rubella (MMR) vaccine. You may need at least one dose of MMR if you were born in 1957 or later. You may also need a second dose.  Pneumococcal 13-valent conjugate (PCV13) vaccine. You may need this if you have certain conditions and were not previously vaccinated.  Pneumococcal polysaccharide (PPSV23) vaccine. You may need one or two doses if you smoke cigarettes or if you have certain conditions.  Meningococcal vaccine. You may need this if you have certain conditions.  Hepatitis A vaccine. You may need this if you have certain conditions or if you travel or work in places where you may be exposed to hepatitis A.  Hepatitis B vaccine. You may need this if you have certain conditions or if you travel or work in places where you may be exposed to hepatitis B.  Haemophilus influenzae type b (Hib) vaccine. You may need this if you have certain conditions. Talk to your health care provider about which screenings and vaccines you need and how often you need them. This information is not intended to replace advice given to you by your health care provider. Make sure you discuss any questions you have with your health care provider. Document Released: 11/21/2015 Document Revised: 12/15/2017 Document Reviewed: 08/26/2015 Elsevier Interactive Patient  Education  2019 [employer].

## 2018-11-08 IMAGING — MR MR CERVICAL SPINE W/O CM
5 series · 30 of 48 positions shown · non-contrast
Comparison: None.

CLINICAL DATA: Pain about the left side of the neck and left ear
for 1.5 months. History of fall 1 year ago.

EXAM:
MRI CERVICAL SPINE WITHOUT CONTRAST
TECHNIQUE: Multiplanar, multisequence MR imaging of the cervical spine was
performed. No intravenous contrast was administered.

[Series 3: T2 · sagittal · 3.0mm · 0.41mm/px · 7 of 13 slices shown (1 of 2)]
[im 1/13]
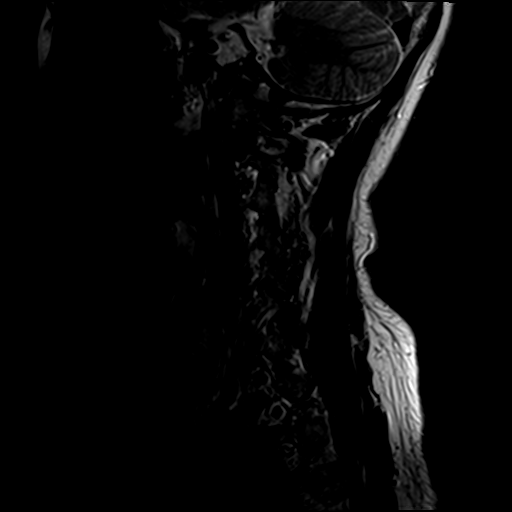
[im 3/13]
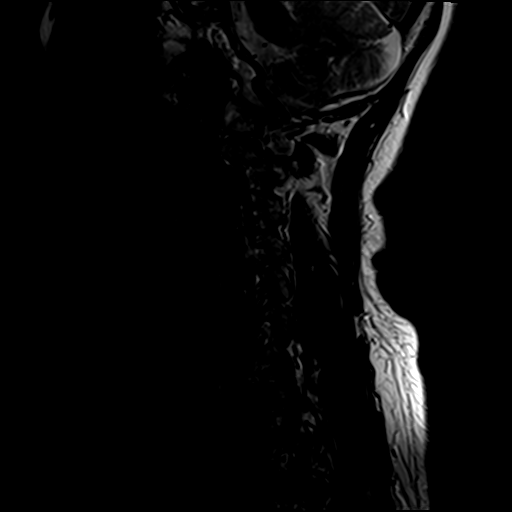
[im 5/13]
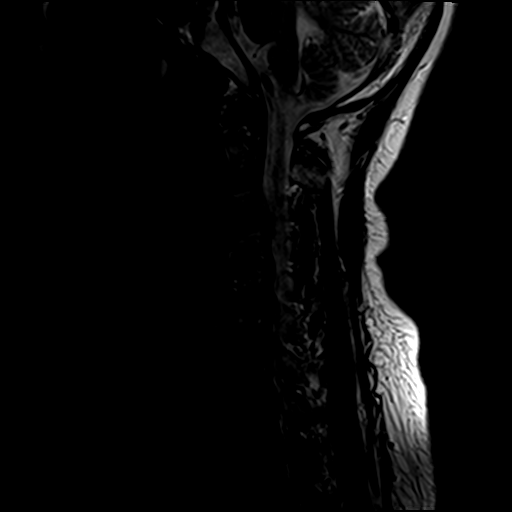
[im 7/13]
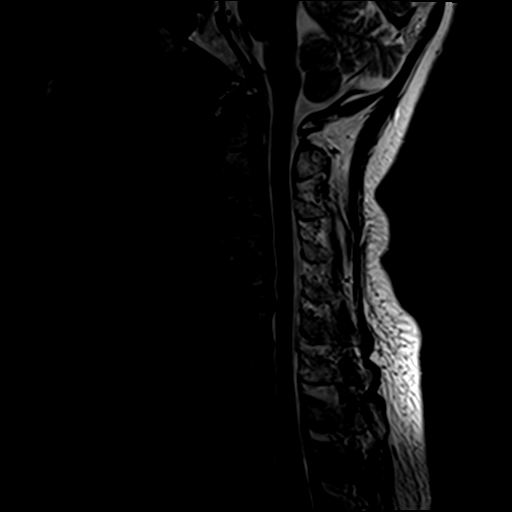
[im 9/13]
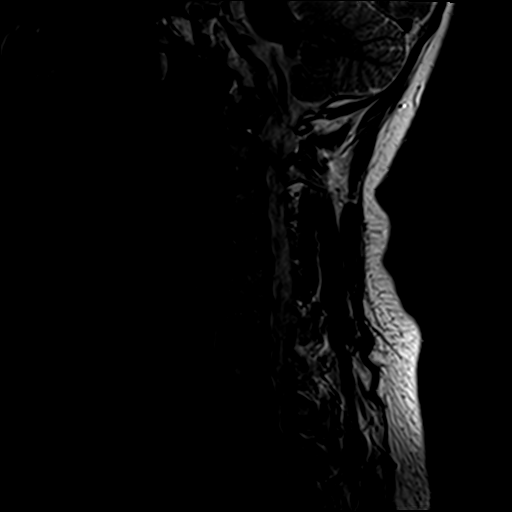
[im 11/13]
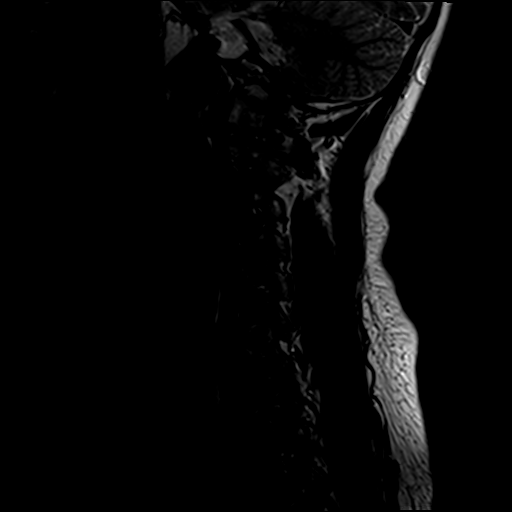
[im 13/13]
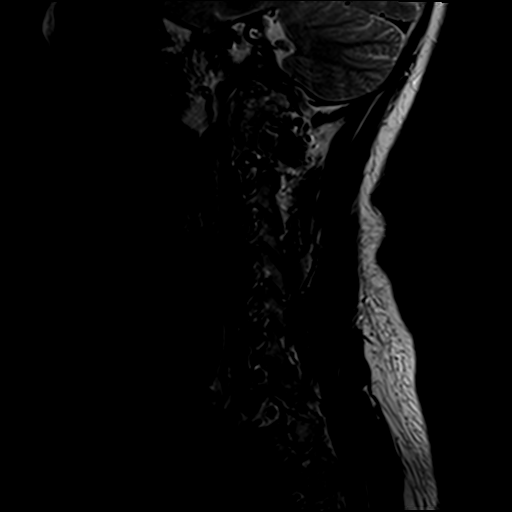

[Series 4: T1 · sagittal · 3.0mm · 0.41mm/px · 7 of 13 slices shown]
[im 1/13]
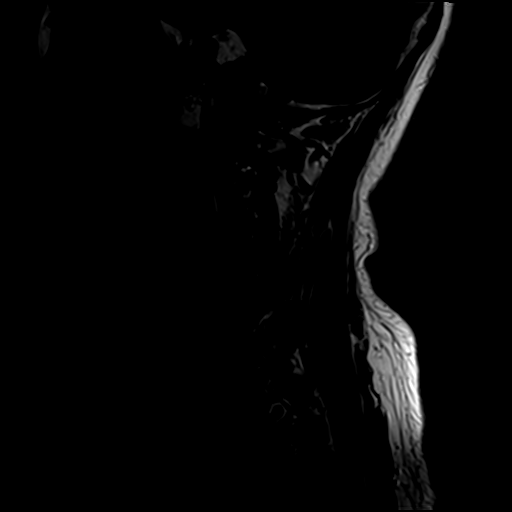
[im 3/13]
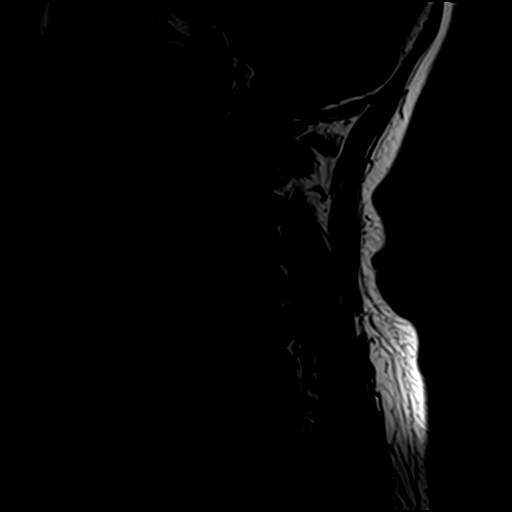
[im 5/13]
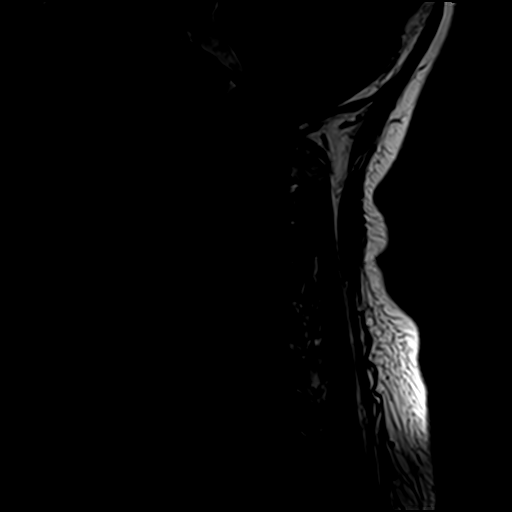
[im 7/13]
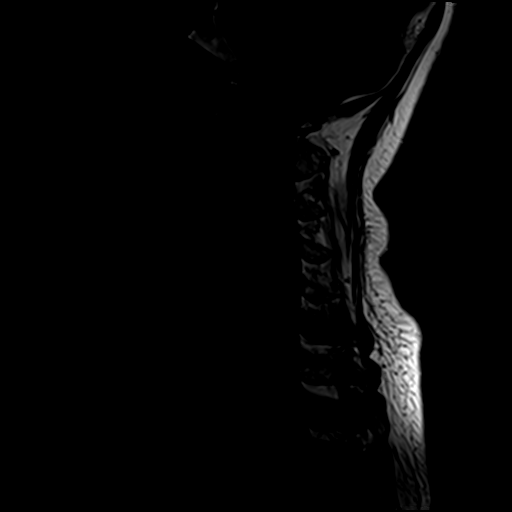
[im 9/13]
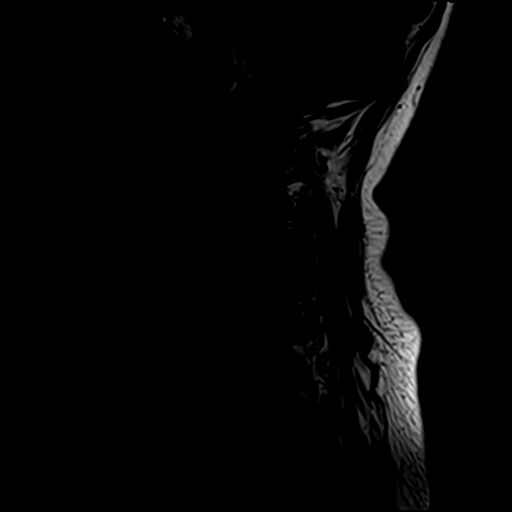
[im 11/13]
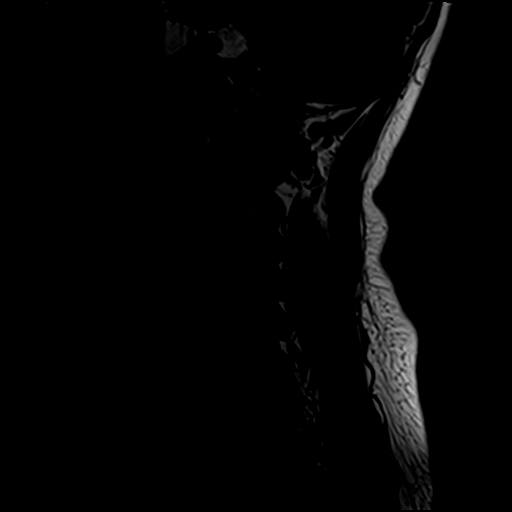
[im 13/13]
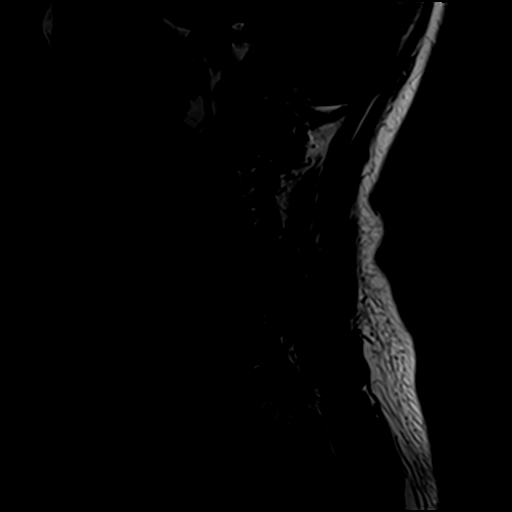

[Series 5: STIR · sagittal · 3.0mm · 0.82mm/px · 7 of 13 slices shown]
[im 1/13]
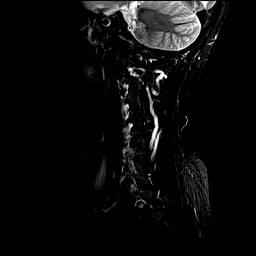
[im 3/13]
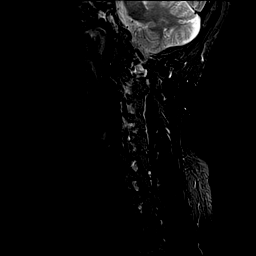
[im 5/13]
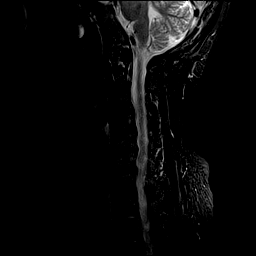
[im 7/13]
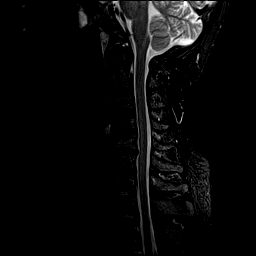
[im 9/13]
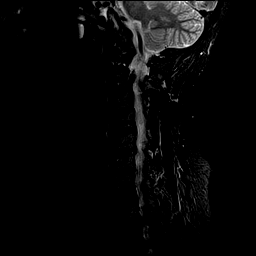
[im 11/13]
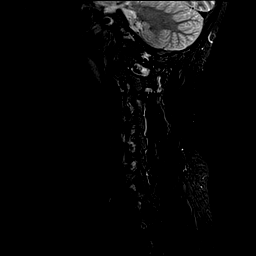
[im 13/13]
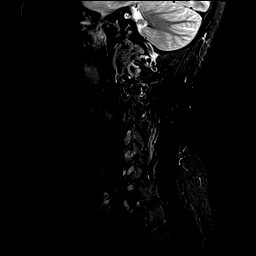

[Series 6: GRE · axial · 3.0mm · 0.35mm/px · 1 of 25 slices shown]
[im 1/25]
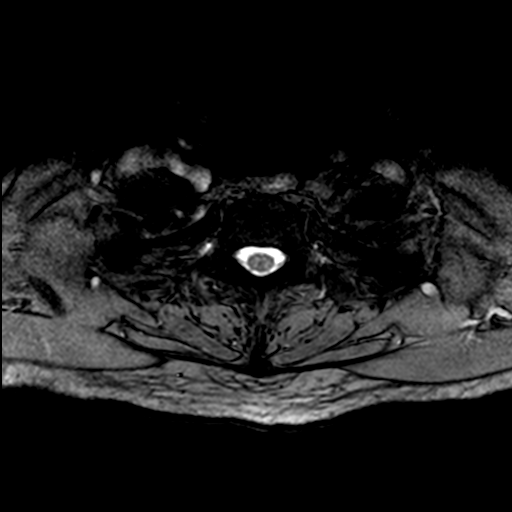

[Series 7: T2 · axial · 3.0mm · 0.70mm/px · z∈[-24,+70]mm · 8 of 26 slices shown (2 of 2)]
[im 1/26]
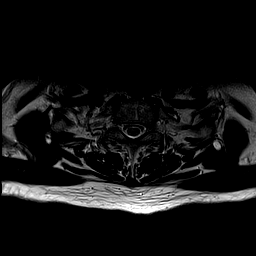
[im 4/26]
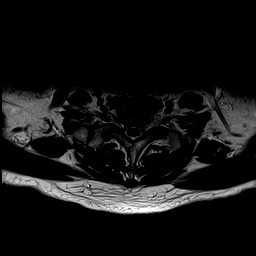
[im 8/26]
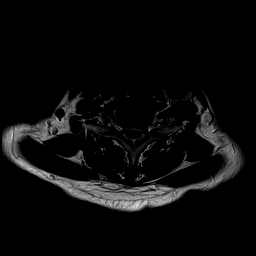
[im 12/26]
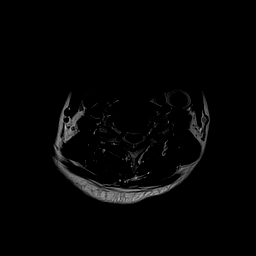
[im 14/26]
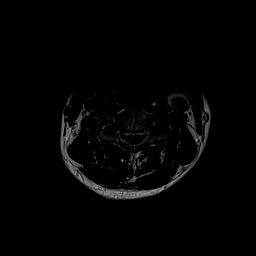
[im 18/26]
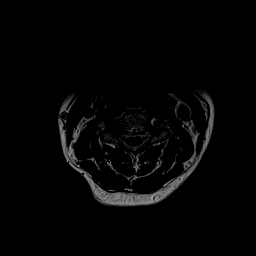
[im 22/26]
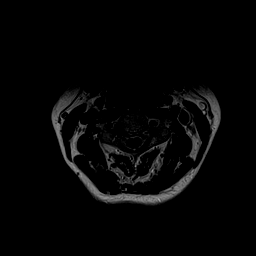
[im 26/26]
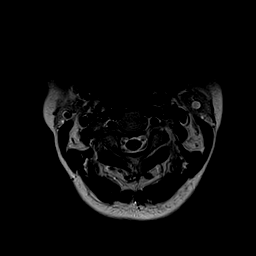

[30 of 48 positions shown; findings below may reference images not displayed]

FINDINGS: Alignment: There is straightening of the normal lordosis.

Vertebrae: No fracture or worrisome lesion.

Cord: Normal signal throughout.

Posterior Fossa, vertebral arteries, paraspinal tissues: Negative.

Disc levels:

C2-3:  Minimal bulge without stenosis.

C3-4:  Negative.

C4-5: Mild uncovertebral spurring without foraminal stenosis. The
central canal is open.

C5-6: Shallow disc osteophyte complex and left and mild
uncovertebral disease on the left. The central canal and right
foramen are open. Mild left foraminal narrowing is seen.

C6-7: Minimal disc bulge without stenosis. The central canal is
open.

C7-T1:  Negative.
IMPRESSION: Mild spondylosis of the cervical spine without central canal
narrowing. Uncovertebral spurring causes some left foraminal
narrowing at C5-6.

## 2018-11-08 IMAGING — MR MR HEAD WO/W CM
13 series · 48 of 48 positions shown · IV contrast (10ml multihance)
Comparison: None.

CLINICAL DATA: Fall last year with head injury. Pain in the left
neck and along the ear.

EXAM:
MRI HEAD WITHOUT AND WITH CONTRAST
TECHNIQUE: Multiplanar, multiecho pulse sequences of the brain and surrounding
structures were obtained without and with intravenous contrast.
CONTRAST:  10mL MULTIHANCE GADOBENATE DIMEGLUMINE 529 MG/ML IV SOLN

[Series 3: T1 · sagittal · 5.0mm · 0.45mm/px · 1 of 21 slices shown]
[im 1/21]
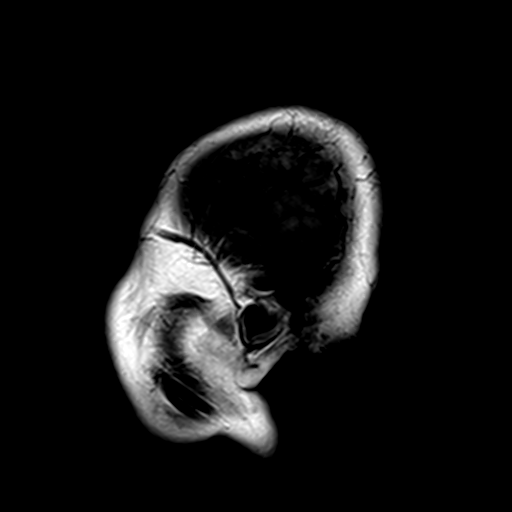

[Series 4: DWI · axial · 3.0mm · 1.80mm/px · z∈[+93,+240]mm · 7 of 100 slices shown (1 of 4)]
[im 1/100]
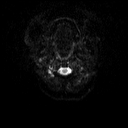
[im 17/100]
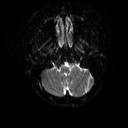
[im 34/100]
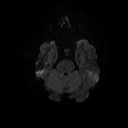
[im 50/100]
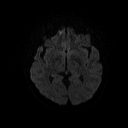
[im 67/100]
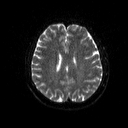
[im 83/100]
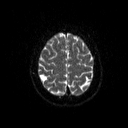
[im 100/100]
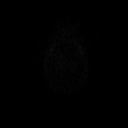

[Series 5: DWI · axial · 3.0mm · 1.80mm/px · z∈[+93,+240]mm · 3 of 49 slices shown (2 of 4)]
[im 1/49]
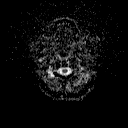
[im 25/49]
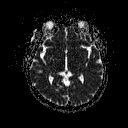
[im 49/49]
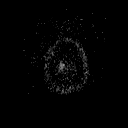

[Series 6: DWI · coronal · 5.0mm · 1.80mm/px · 4 of 62 slices shown (3 of 4)]
[im 1/62]
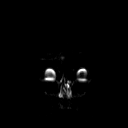
[im 21/62]
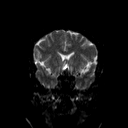
[im 41/62]
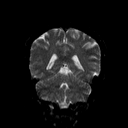
[im 62/62]
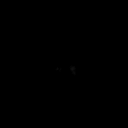

[Series 7: DWI · coronal · 5.0mm · 1.80mm/px · 2 of 31 slices shown (4 of 4)]
[im 1/31]
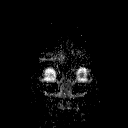
[im 31/31]
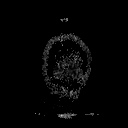

[Series 8: T2 · axial · 5.0mm · 0.51mm/px · z∈[+96,+238]mm · 2 of 22 slices shown (1 of 2)]
[im 1/22]
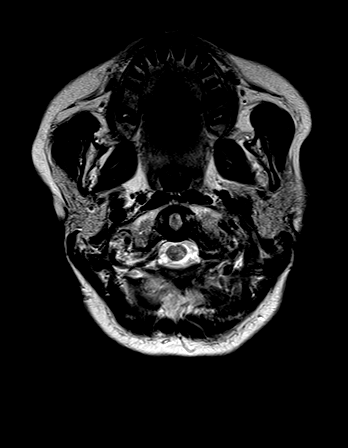
[im 22/22]
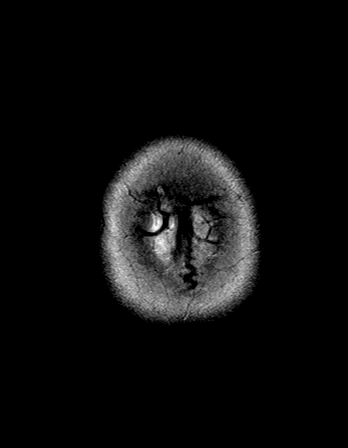

[Series 9: FLAIR · axial · 3.0mm · 0.45mm/px · z∈[+94,+239]mm · 2 of 32 slices shown]
[im 1/32]
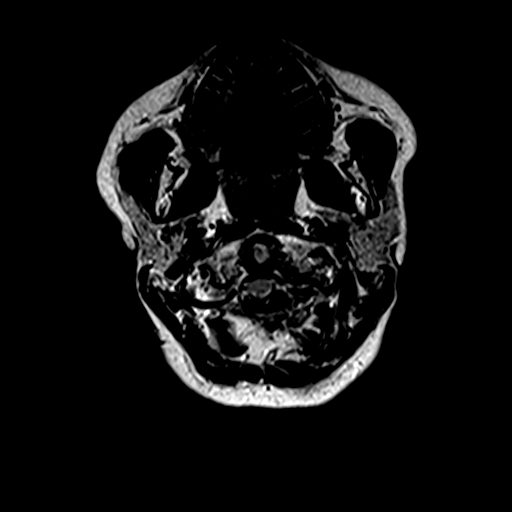
[im 32/32]
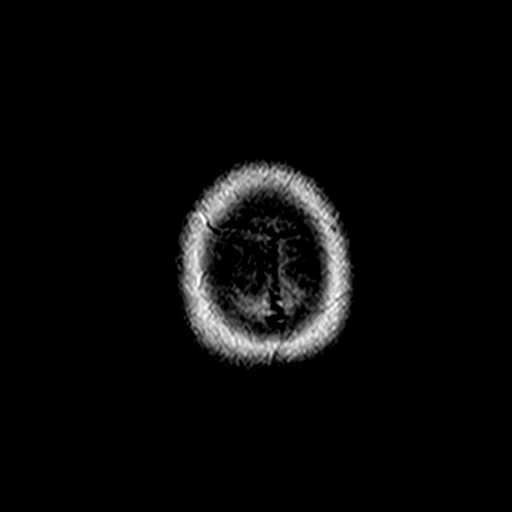

[Series 11: swi_images · axial · 5.0mm · 0.90mm/px · z∈[+94,+239]mm · 2 of 30 slices shown]
[im 1/30]
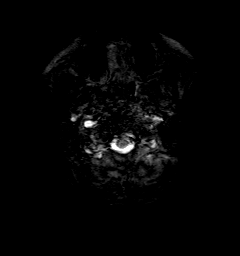
[im 30/30]
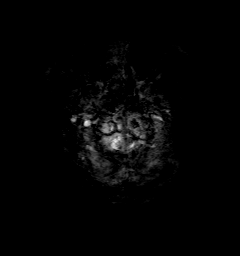

[Series 12: t1_mpr_tra · axial · 1.0mm · 0.75mm/px · z∈[+95,+238]mm · 10 of 144 slices shown (1 of 2)]
[im 1/144]
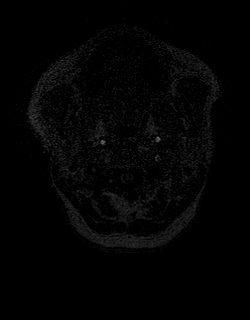
[im 16/144]
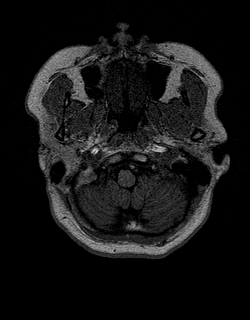
[im 32/144]
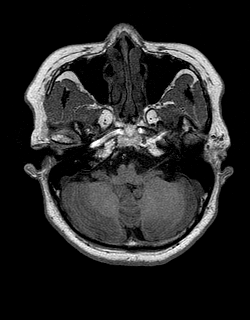
[im 48/144]
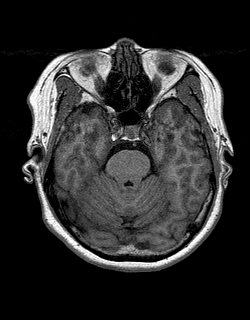
[im 64/144]
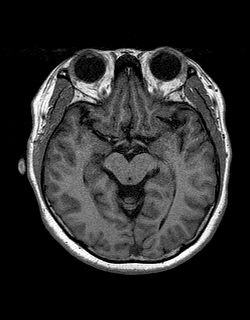
[im 80/144]
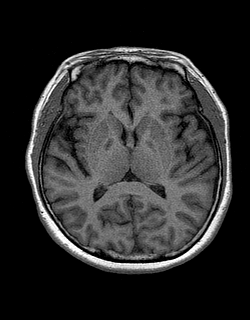
[im 96/144]
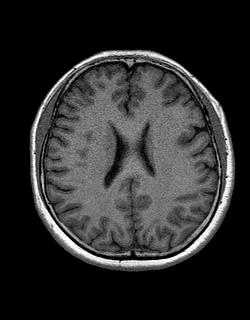
[im 112/144]
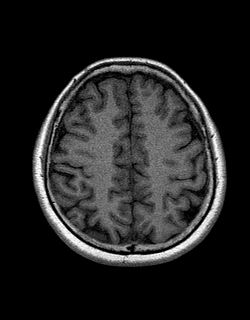
[im 128/144]
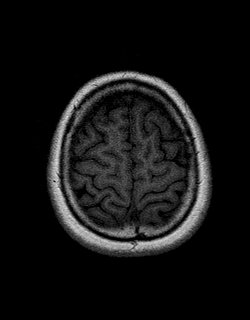
[im 144/144]
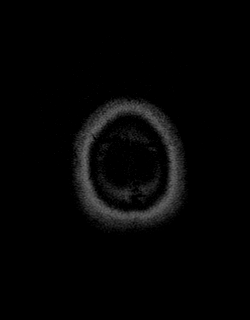

[Series 13: T2 · coronal · 5.0mm · 0.45mm/px · 2 of 23 slices shown (2 of 2)]
[im 1/23]
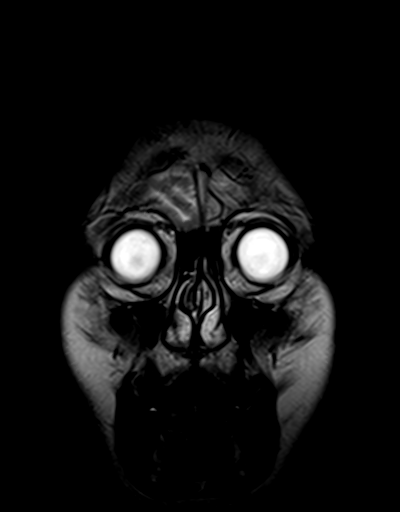
[im 23/23]
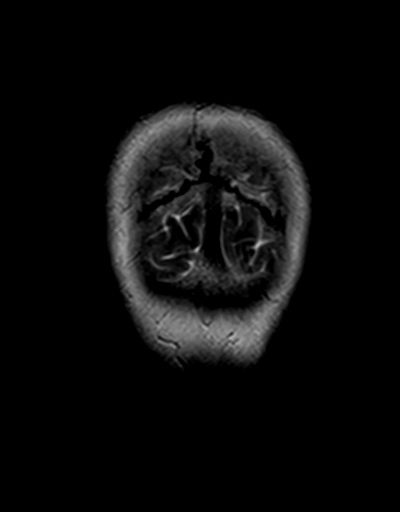

[Series 14: t1_mpr_tra · axial · 1.0mm · 0.75mm/px · z∈[+95,+238]mm · 10 of 144 slices shown (2 of 2)]
[im 1/144]
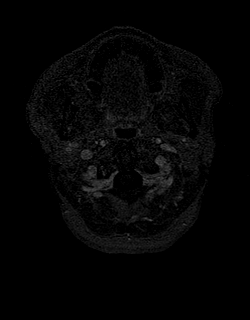
[im 16/144]
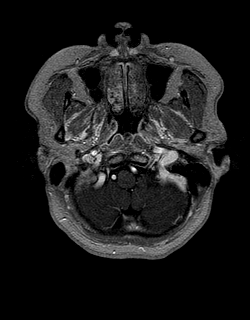
[im 32/144]
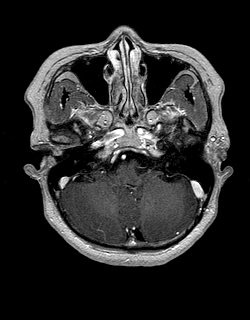
[im 48/144]
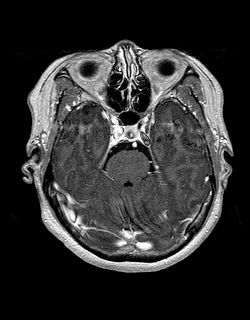
[im 64/144]
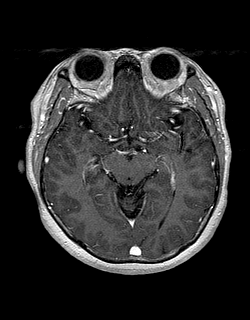
[im 80/144]
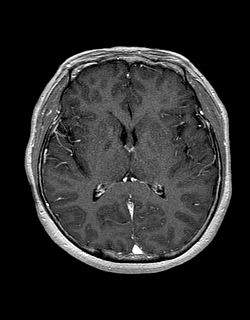
[im 96/144]
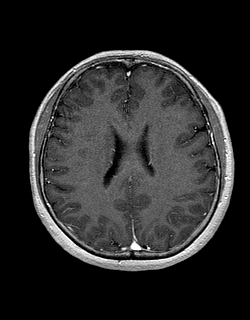
[im 112/144]
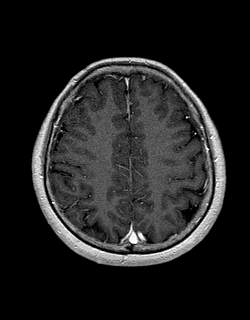
[im 128/144]
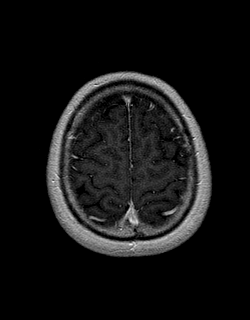
[im 144/144]
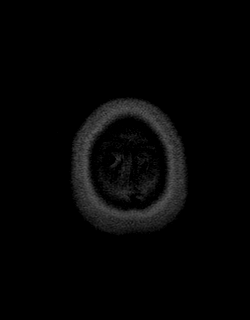

[Series 15: post cor · coronal · 5.0mm · 0.45mm/px · 2 of 23 slices shown]
[im 1/23]
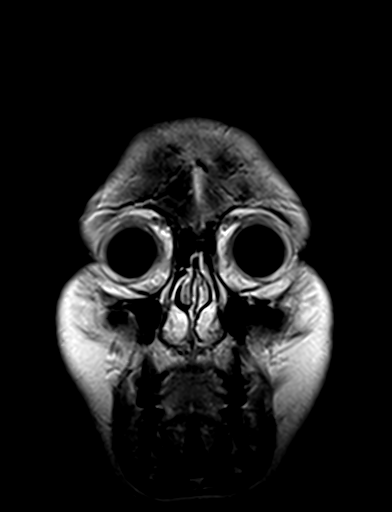
[im 23/23]
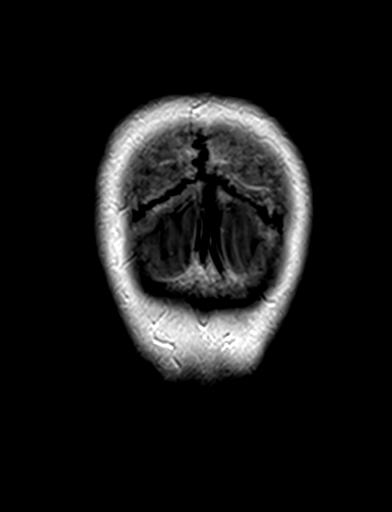

[Series 16: post sag (optional · sagittal · 5.0mm · 0.45mm/px · 1 of 21 slices shown]
[im 1/21]
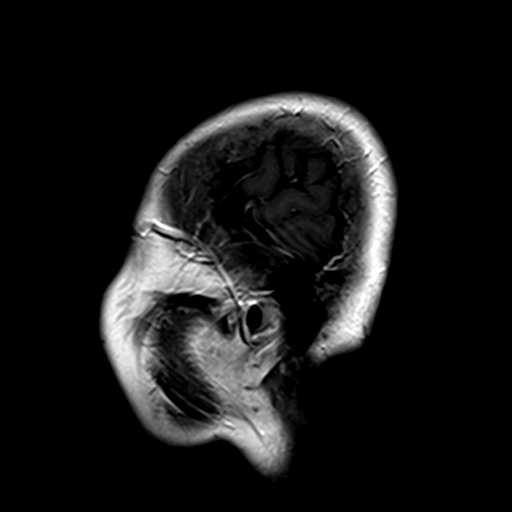

[48 of 48 positions shown; findings below may reference images not displayed]

FINDINGS: Brain: There is no evidence of acute infarct, intracranial
hemorrhage, mass, midline shift, or extra-axial fluid collection.
The ventricles and sulci are normal. The brain is normal in signal.
No abnormal enhancement is identified.

Vascular: Major intracranial vascular flow voids are preserved.

Skull and upper cervical spine: Unremarkable bone marrow signal.

Sinuses/Orbits: Unremarkable orbits. Paranasal sinuses and mastoid
air cells are clear.

Other: None.
IMPRESSION: Unremarkable brain MRI.

## 2018-12-14 ENCOUNTER — Encounter: Payer: Self-pay | Admitting: Family Medicine

## 2018-12-18 ENCOUNTER — Encounter: Payer: Self-pay | Admitting: Family Medicine

## 2018-12-18 LAB — HM MAMMOGRAPHY

## 2018-12-21 ENCOUNTER — Encounter: Payer: Self-pay | Admitting: Family Medicine

## 2019-01-09 ENCOUNTER — Telehealth: Payer: Self-pay | Admitting: Internal Medicine

## 2019-01-09 MED ORDER — DICYCLOMINE HCL 10 MG PO CAPS: 10.0000 mg | ORAL_CAPSULE | Freq: Four times a day (QID) | ORAL | 1 refills | Status: DC | PRN

## 2019-01-09 NOTE — Telephone Encounter (Signed)
Refill x 2 ok 

## 2019-01-09 NOTE — Telephone Encounter (Signed)
May I refill, seen 01/2018 Sir. Thank you.

## 2019-01-09 NOTE — Telephone Encounter (Signed)
Dicyclomine refilled as requested. 

## 2019-01-09 NOTE — Telephone Encounter (Signed)
Pt requested a refill for dicyclomine.

## 2019-01-25 ENCOUNTER — Ambulatory Visit: Payer: BLUE CROSS/BLUE SHIELD | Admitting: Family Medicine

## 2019-07-14 ENCOUNTER — Encounter: Payer: Self-pay | Admitting: *Deleted

## 2019-08-13 ENCOUNTER — Other Ambulatory Visit: Payer: Self-pay

## 2019-08-13 MED ORDER — DICYCLOMINE HCL 10 MG PO CAPS: 10.0000 mg | ORAL_CAPSULE | Freq: Four times a day (QID) | ORAL | 0 refills | Status: AC | PRN

## 2019-09-03 ENCOUNTER — Telehealth: Payer: Self-pay | Admitting: Internal Medicine

## 2019-09-03 NOTE — Telephone Encounter (Signed)
Pt's daughter Alfred Levins states that her mother is still dealing with acid reflux, she takes Nexium OTC but is not helping, she wants to know if Dr. Carlean Purl can prescribe something.

## 2019-09-03 NOTE — Telephone Encounter (Signed)
Patient has not been seen for over a year.  Patient is offered an appt for Wed with APP , but she declines .  Mom has a [employer] plan that we are not in network for.  She is advised that she should go to a MD that takes her insurance.  She thanked me for the call

## 2020-07-13 ENCOUNTER — Encounter: Payer: Self-pay | Admitting: *Deleted

## 2021-04-29 ENCOUNTER — Encounter: Payer: Self-pay | Admitting: *Deleted

## 2021-04-29 NOTE — Congregational Nurse Program (Signed)
Pet died on Saturday,  Lives by herself, spiritual care done by grieving process support.

## 2022-09-04 ENCOUNTER — Ambulatory Visit: Payer: BLUE CROSS/BLUE SHIELD

## 2023-04-01 ENCOUNTER — Encounter: Payer: Self-pay | Admitting: Internal Medicine
